# Patient Record
Sex: Male | Born: 1950 | Race: White | Hispanic: No | Smoking: Former smoker
Health system: Southern US, Community
[De-identification: ages and names within clinical notes are randomized; demographics above are authoritative.]

## PROBLEM LIST (undated history)

## (undated) DIAGNOSIS — R011 Cardiac murmur, unspecified: Secondary | ICD-10-CM

## (undated) DIAGNOSIS — E785 Hyperlipidemia, unspecified: Secondary | ICD-10-CM

## (undated) DIAGNOSIS — C61 Malignant neoplasm of prostate: Secondary | ICD-10-CM

## (undated) DIAGNOSIS — K759 Inflammatory liver disease, unspecified: Secondary | ICD-10-CM

## (undated) HISTORY — PX: TONSILLECTOMY: SUR1361

## (undated) HISTORY — PX: MOUTH SURGERY: SHX715

## (undated) HISTORY — DX: Hyperlipidemia, unspecified: E78.5

## (undated) HISTORY — DX: Inflammatory liver disease, unspecified: K75.9

## (undated) HISTORY — DX: Cardiac murmur, unspecified: R01.1

## (undated) HISTORY — PX: SKIN BIOPSY: SHX1

---
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
## 2007-10-24 ENCOUNTER — Ambulatory Visit: Payer: Self-pay | Admitting: Gastroenterology

## 2013-07-23 ENCOUNTER — Ambulatory Visit (INDEPENDENT_AMBULATORY_CARE_PROVIDER_SITE_OTHER): Payer: BC Managed Care – PPO

## 2013-07-23 ENCOUNTER — Ambulatory Visit (INDEPENDENT_AMBULATORY_CARE_PROVIDER_SITE_OTHER): Payer: BC Managed Care – PPO | Admitting: Podiatry

## 2013-07-23 ENCOUNTER — Encounter: Payer: Self-pay | Admitting: Podiatry

## 2013-07-23 VITALS — BP 117/77 | HR 74 | Resp 16 | Ht 76.0 in | Wt 250.0 lb

## 2013-07-23 DIAGNOSIS — M722 Plantar fascial fibromatosis: Secondary | ICD-10-CM

## 2013-07-23 NOTE — Progress Notes (Signed)
  Subjective:    Patient ID: Roy Joyce, male    DOB: 1951-01-11, 62 y.o.   MRN: [id number]  HPI Comments: I've got a heel that hurts, the left"  Plantar heel left  N - sharp L - plantar heel left D - 3 mos  O - gradual C - AM pain, worse, active walker (5-6 miles x 5 days/wk) A - walking T - no treatment      Review of Systems  All other systems reviewed and are negative.       Objective:   Physical Exam : I have evaluated his past medical history medications and allergies as well as his review of systems. Vital signs are stable he is alert and oriented x3. Lower extremity evaluation demonstrates strong palpable pulses bilateral deep tendon reflexes are intact bilateral neurologic sensorium is intact per Semmes-Weinstein monofilament bilateral orthopedic evaluation demonstrates pain on palpation medial calcaneal tubercle of the left heel. Radiographic evaluation demonstrates soft tissue increase in density at the plantar fascial calcaneal insertion site digit of a plantar fasciitis.        Assessment & Plan:  Impression: Plantar fasciitis left  Plan: We discussed the etiology pathology conservative versus surgical therapies. At this point we have initiated therapy with the injection of Kenalog 20 mg and local anesthetic to the point of maximal tenderness of his left heel. Also wrote her prescription for Medrol Dosepak to be followed by Mobic and gave him instructions on this. Also given a plantar fascial strapping and a night splint. Discussed appropriate shoe gear stretching exercises and ice therapy. I will followup with him in one month.

## 2013-07-23 NOTE — Patient Instructions (Signed)
Plantar Fasciitis Plantar fasciitis is a common condition that causes foot pain. It is soreness (inflammation) of the band of tough fibrous tissue on the bottom of the foot that runs from the heel bone (calcaneus) to the ball of the foot. The cause of this soreness may be from excessive standing, poor fitting shoes, running on hard surfaces, being overweight, having an abnormal walk, or overuse (this is common in runners) of the painful foot or feet. It is also common in aerobic exercise dancers and ballet dancers. SYMPTOMS  Most people with plantar fasciitis complain of:  Severe pain in the morning on the bottom of their foot especially when taking the first steps out of bed. This pain recedes after a few minutes of walking.  Severe pain is experienced also during walking following a long period of inactivity.  Pain is worse when walking barefoot or up stairs DIAGNOSIS   Your caregiver will diagnose this condition by examining and feeling your foot.  Special tests such as X-rays of your foot, are usually not needed. PREVENTION   Consult a sports medicine professional before beginning a new exercise program.  Walking programs offer a good workout. With walking there is a lower chance of overuse injuries common to runners. There is less impact and less jarring of the joints.  Begin all new exercise programs slowly. If problems or pain develop, decrease the amount of time or distance until you are at a comfortable level.  Wear good shoes and replace them regularly.  Stretch your foot and the heel cords at the back of the ankle (Achilles tendon) both before and after exercise.  Run or exercise on even surfaces that are not hard. For example, asphalt is better than pavement.  Do not run barefoot on hard surfaces.  If using a treadmill, vary the incline.  Do not continue to workout if you have foot or joint problems. Seek professional help if they do not improve. HOME CARE INSTRUCTIONS     Avoid activities that cause you pain until you recover.  Use ice or cold packs on the problem or painful areas after working out.  Only take over-the-counter or prescription medicines for pain, discomfort, or fever as directed by your caregiver.  Soft shoe inserts or athletic shoes with air or gel sole cushions may be helpful.  If problems continue or become more severe, consult a sports medicine caregiver or your own health care provider. Cortisone is a potent anti-inflammatory medication that may be injected into the painful area. You can discuss this treatment with your caregiver. MAKE SURE YOU:   Understand these instructions.  Will watch your condition.  Will get help right away if you are not doing well or get worse. Document Released: 06/06/2001 Document Revised: 12/04/2011 Document Reviewed: 08/05/2008 [employer] Patient Information 2014 [employer]. Plantar Fasciitis (Heel Spur Syndrome) with Rehab The plantar fascia is a fibrous, ligament-like, soft-tissue structure that spans the bottom of the foot. Plantar fasciitis is a condition that causes pain in the foot due to inflammation of the tissue. SYMPTOMS  Pain and tenderness on the underneath side of the foot. Pain that worsens with standing or walking. CAUSES  Plantar fasciitis is caused by irritation and injury to the plantar fascia on the underneath side of the foot. Common mechanisms of injury include: Direct trauma to bottom of the foot. Damage to a small nerve that runs under the foot where the main fascia attaches to the heel bone. Stress placed on the plantar fascia due   to bone spurs. RISK INCREASES WITH:  Activities that place stress on the plantar fascia (running, jumping, pivoting, or cutting). Poor strength and flexibility. Improperly fitted shoes. Tight calf muscles. Flat feet. Failure to warm-up properly before activity. Obesity. PREVENTION Warm up and stretch properly before activity. Allow for  adequate recovery between workouts. Maintain physical fitness: Strength, flexibility, and endurance. Cardiovascular fitness. Maintain a health body weight. Avoid stress on the plantar fascia. Wear properly fitted shoes, including arch supports for individuals who have flat feet. PROGNOSIS  If treated properly, then the symptoms of plantar fasciitis usually resolve without surgery. However, occasionally surgery is necessary. RELATED COMPLICATIONS  Recurrent symptoms that may result in a chronic condition. Problems of the lower back that are caused by compensating for the injury, such as limping. Pain or weakness of the foot during push-off following surgery. Chronic inflammation, scarring, and partial or complete fascia tear, occurring more often from repeated injections. TREATMENT  Treatment initially involves the use of ice and medication to help reduce pain and inflammation. The use of strengthening and stretching exercises may help reduce pain with activity, especially stretches of the Achilles tendon. These exercises may be performed at home or with a therapist. Your caregiver may recommend that you use heel cups of arch supports to help reduce stress on the plantar fascia. Occasionally, corticosteroid injections are given to reduce inflammation. If symptoms persist for greater than 6 months despite non-surgical (conservative), then surgery may be recommended.  MEDICATION  If pain medication is necessary, then nonsteroidal anti-inflammatory medications, such as aspirin and ibuprofen, or other minor pain relievers, such as acetaminophen, are often recommended. Do not take pain medication within 7 days before surgery. Prescription pain relievers may be given if deemed necessary by your caregiver. Use only as directed and only as much as you need. Corticosteroid injections may be given by your caregiver. These injections should be reserved for the most serious cases, because they may only be  given a certain number of times. HEAT AND COLD Cold treatment (icing) relieves pain and reduces inflammation. Cold treatment should be applied for 10 to 15 minutes every 2 to 3 hours for inflammation and pain and immediately after any activity that aggravates your symptoms. Use ice packs or massage the area with a piece of ice (ice massage). Heat treatment may be used prior to performing the stretching and strengthening activities prescribed by your caregiver, physical therapist, or athletic trainer. Use a heat pack or soak the injury in warm water. SEEK IMMEDIATE MEDICAL CARE IF: Treatment seems to offer no benefit, or the condition worsens. Any medications produce adverse side effects. EXERCISES RANGE OF MOTION (ROM) AND STRETCHING EXERCISES - Plantar Fasciitis (Heel Spur Syndrome) These exercises may help you when beginning to rehabilitate your injury. Your symptoms may resolve with or without further involvement from your physician, physical therapist or athletic trainer. While completing these exercises, remember:  Restoring tissue flexibility helps normal motion to return to the joints. This allows healthier, less painful movement and activity. An effective stretch should be held for at least 30 seconds. A stretch should never be painful. You should only feel a gentle lengthening or release in the stretched tissue. RANGE OF MOTION - Toe Extension, Flexion Sit with your right / left leg crossed over your opposite knee. Grasp your toes and gently pull them back toward the top of your foot. You should feel a stretch on the bottom of your toes and/or foot. Hold this stretch for __________ seconds. Now, gently   pull your toes toward the bottom of your foot. You should feel a stretch on the top of your toes and or foot. Hold this stretch for __________ seconds. Repeat __________ times. Complete this stretch __________ times per day.  RANGE OF MOTION - Ankle Dorsiflexion, Active Assisted Remove  shoes and sit on a chair that is preferably not on a carpeted surface. Place right / left foot under knee. Extend your opposite leg for support. Keeping your heel down, slide your right / left foot back toward the chair until you feel a stretch at your ankle or calf. If you do not feel a stretch, slide your bottom forward to the edge of the chair, while still keeping your heel down. Hold this stretch for __________ seconds. Repeat __________ times. Complete this stretch __________ times per day.  STRETCH  Gastroc, Standing Place hands on wall. Extend right / left leg, keeping the front knee somewhat bent. Slightly point your toes inward on your back foot. Keeping your right / left heel on the floor and your knee straight, shift your weight toward the wall, not allowing your back to arch. You should feel a gentle stretch in the right / left calf. Hold this position for __________ seconds. Repeat __________ times. Complete this stretch __________ times per day. STRETCH  Soleus, Standing Place hands on wall. Extend right / left leg, keeping the other knee somewhat bent. Slightly point your toes inward on your back foot. Keep your right / left heel on the floor, bend your back knee, and slightly shift your weight over the back leg so that you feel a gentle stretch deep in your back calf. Hold this position for __________ seconds. Repeat __________ times. Complete this stretch __________ times per day. STRETCH  Gastrocsoleus, Standing  Note: This exercise can place a lot of stress on your foot and ankle. Please complete this exercise only if specifically instructed by your caregiver.  Place the ball of your right / left foot on a step, keeping your other foot firmly on the same step. Hold on to the wall or a rail for balance. Slowly lift your other foot, allowing your body weight to press your heel down over the edge of the step. You should feel a stretch in your right / left calf. Hold this  position for __________ seconds. Repeat this exercise with a slight bend in your right / left knee. Repeat __________ times. Complete this stretch __________ times per day.  STRENGTHENING EXERCISES - Plantar Fasciitis (Heel Spur Syndrome)  These exercises may help you when beginning to rehabilitate your injury. They may resolve your symptoms with or without further involvement from your physician, physical therapist or athletic trainer. While completing these exercises, remember:  Muscles can gain both the endurance and the strength needed for everyday activities through controlled exercises. Complete these exercises as instructed by your physician, physical therapist or athletic trainer. Progress the resistance and repetitions only as guided. STRENGTH - Towel Curls Sit in a chair positioned on a non-carpeted surface. Place your foot on a towel, keeping your heel on the floor. Pull the towel toward your heel by only curling your toes. Keep your heel on the floor. If instructed by your physician, physical therapist or athletic trainer, add ____________________ at the end of the towel. Repeat __________ times. Complete this exercise __________ times per day. STRENGTH - Ankle Inversion Secure one end of a rubber exercise band/tubing to a fixed object (table, pole). Loop the other end around your foot   just before your toes. Place your fists between your knees. This will focus your strengthening at your ankle. Slowly, pull your big toe up and in, making sure the band/tubing is positioned to resist the entire motion. Hold this position for __________ seconds. Have your muscles resist the band/tubing as it slowly pulls your foot back to the starting position. Repeat __________ times. Complete this exercises __________ times per day.  Document Released: 09/11/2005 Document Revised: 12/04/2011 Document Reviewed: 12/24/2008 [employer] Patient Information 2014 [employer].  

## 2013-07-24 ENCOUNTER — Other Ambulatory Visit: Payer: Self-pay | Admitting: Podiatry

## 2013-07-24 ENCOUNTER — Telehealth: Payer: Self-pay | Admitting: *Deleted

## 2013-07-24 MED ORDER — METHYLPREDNISOLONE (PAK) 4 MG PO TABS: ORAL_TABLET | ORAL | Status: DC

## 2013-07-24 MED ORDER — MELOXICAM 15 MG PO TABS: 15.0000 mg | ORAL_TABLET | Freq: Every day | ORAL | Status: DC

## 2013-07-24 NOTE — Telephone Encounter (Signed)
Sorry. But pt came in yesterday wed 10.29.14. i didn't put this info in earlier note...Marland KitchenMarland Kitchen

## 2013-07-24 NOTE — Telephone Encounter (Signed)
Pt called said you were supposed to send a rx to his pharmacy. Nothing is there. ([facility] is correct in computer)

## 2013-07-24 NOTE — Telephone Encounter (Signed)
Called pt letting him know that rx should be at pharmacy.

## 2013-08-25 ENCOUNTER — Ambulatory Visit (INDEPENDENT_AMBULATORY_CARE_PROVIDER_SITE_OTHER): Payer: BC Managed Care – PPO | Admitting: Podiatry

## 2013-08-25 ENCOUNTER — Encounter: Payer: Self-pay | Admitting: Podiatry

## 2013-08-25 VITALS — BP 123/77 | HR 99 | Ht 76.0 in | Wt 258.0 lb

## 2013-08-25 DIAGNOSIS — M722 Plantar fascial fibromatosis: Secondary | ICD-10-CM

## 2013-08-25 NOTE — Progress Notes (Signed)
Roy Joyce presents today for followup of plantar fasciitis left foot states it is getting better.  Objective: Pulses remain palpable left lower extremity no calf pain. Pain on palpation medial calcaneal tubercle of the left heel.  Assessment: Residual plantar fasciitis her left.  Plan: Reinjected left heel today continue all conservative therapies. Remember at to ask him how history to [place] was and whether or not he went diving

## 2013-09-22 ENCOUNTER — Ambulatory Visit (INDEPENDENT_AMBULATORY_CARE_PROVIDER_SITE_OTHER): Payer: BC Managed Care – PPO | Admitting: Podiatry

## 2013-09-22 ENCOUNTER — Encounter: Payer: Self-pay | Admitting: Podiatry

## 2013-09-22 VITALS — BP 133/69 | HR 76 | Resp 16

## 2013-09-22 DIAGNOSIS — M722 Plantar fascial fibromatosis: Secondary | ICD-10-CM

## 2013-09-22 NOTE — Progress Notes (Signed)
   Subjective:    Patient ID: Roy Joyce, male    DOB: [birth year], 62 y.o.   MRN: [id number]  HPI Comments: " NOT MUCH CHANGE , ITS ABOUT THE SAME     Review of Systems     Objective:   Physical Exam: Vital signs are stable he is alert and oriented x3. Some residual that tenderness to his left plantar heel at the calcaneal insertion site of the plantar fascia.          Assessment & Plan:  Assessment: Plantar fasciitis resolving in nature left heel.  Plan: Injected left heel third time today Kenalog and local anesthetic. Followup with him in 6 weeks if needed.  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

## 2013-11-03 ENCOUNTER — Ambulatory Visit: Payer: BC Managed Care – PPO | Admitting: Podiatry

## 2013-11-17 ENCOUNTER — Other Ambulatory Visit: Payer: Self-pay | Admitting: *Deleted

## 2013-11-17 MED ORDER — MELOXICAM 15 MG PO TABS: 15.0000 mg | ORAL_TABLET | Freq: Every day | ORAL | Status: DC

## 2013-11-17 NOTE — Telephone Encounter (Signed)
Received refill request for mobic 15 mg . Approved it

## 2014-05-04 ENCOUNTER — Encounter: Payer: Self-pay | Admitting: Podiatry

## 2014-05-04 ENCOUNTER — Ambulatory Visit (INDEPENDENT_AMBULATORY_CARE_PROVIDER_SITE_OTHER): Payer: BC Managed Care – PPO | Admitting: Podiatry

## 2014-05-04 VITALS — BP 123/69 | HR 66 | Resp 16

## 2014-05-04 DIAGNOSIS — M722 Plantar fascial fibromatosis: Secondary | ICD-10-CM

## 2014-05-04 MED ORDER — MELOXICAM 15 MG PO TABS: 15.0000 mg | ORAL_TABLET | Freq: Every day | ORAL | Status: DC

## 2014-05-04 MED ORDER — METHYLPREDNISOLONE (PAK) 4 MG PO TABS: ORAL_TABLET | ORAL | Status: DC

## 2014-05-04 NOTE — Progress Notes (Signed)
He presents today with a chief complaint of pain to his left heel. He states it started to get really bad once again last week.  Objective: Vital signs are stable he is alert and oriented x3. Pain on palpation medial continued tubercle and plantar calcaneal tubercle of his left heel.  Assessment: Plantar fasciitis left.  Plan: Injected his left heel central fascial band. Started him on his Medrol Dosepak to be followed by meloxicam. Plantar fascial strapping was applied. He'll followup with him in one month he will continue all conservative therapies.

## 2014-06-03 ENCOUNTER — Ambulatory Visit (INDEPENDENT_AMBULATORY_CARE_PROVIDER_SITE_OTHER): Payer: BC Managed Care – PPO | Admitting: Podiatry

## 2014-06-03 VITALS — BP 106/70 | HR 79 | Resp 16

## 2014-06-03 DIAGNOSIS — M722 Plantar fascial fibromatosis: Secondary | ICD-10-CM

## 2014-06-03 NOTE — Progress Notes (Signed)
He presents today for his second visit since his last flareup. He states that he is approximately 60% improved.  Objective: Vital signs are stable alert and oriented x3. Pain on palpation left heel.  Assessment: Plantar fasciitis resolving.  Plan: Discussed the etiology pathology conservative versus surgical therapies reinjected the left heel today with Kenalog and local anesthetic he'll continue all of his conservative therapies including meloxicam and followup with me in one month if necessary to

## 2014-07-01 ENCOUNTER — Ambulatory Visit (INDEPENDENT_AMBULATORY_CARE_PROVIDER_SITE_OTHER): Payer: BC Managed Care – PPO | Admitting: Podiatry

## 2014-07-01 VITALS — BP 107/75 | HR 80 | Resp 16

## 2014-07-01 DIAGNOSIS — M722 Plantar fascial fibromatosis: Secondary | ICD-10-CM

## 2014-07-01 NOTE — Progress Notes (Signed)
He presents today stating that he is approximately 80% improved.  Objective: Vital signs are stable he is alert and oriented x3. He has pain on palpation medial continued tubercle of the left heel. Particularly a lateral aspect.  Assessment: Plantar fasciitis laterally located left foot.  Plan: Discussed the possible need for orthotics next visit. And injected his left heel today.

## 2014-07-29 ENCOUNTER — Ambulatory Visit: Payer: BC Managed Care – PPO | Admitting: Podiatry

## 2014-08-05 ENCOUNTER — Ambulatory Visit: Payer: BC Managed Care – PPO | Admitting: Podiatry

## 2014-08-24 ENCOUNTER — Other Ambulatory Visit: Payer: Self-pay | Admitting: Podiatry

## 2015-03-09 ENCOUNTER — Ambulatory Visit (INDEPENDENT_AMBULATORY_CARE_PROVIDER_SITE_OTHER): Payer: BLUE CROSS/BLUE SHIELD | Admitting: Urology

## 2015-03-09 VITALS — BP 110/75 | HR 71 | Ht 76.0 in | Wt 242.5 lb

## 2015-03-09 DIAGNOSIS — R972 Elevated prostate specific antigen [PSA]: Secondary | ICD-10-CM

## 2015-03-09 DIAGNOSIS — N529 Male erectile dysfunction, unspecified: Secondary | ICD-10-CM | POA: Diagnosis not present

## 2015-03-09 DIAGNOSIS — E785 Hyperlipidemia, unspecified: Secondary | ICD-10-CM | POA: Diagnosis not present

## 2015-03-09 NOTE — Progress Notes (Signed)
03/09/2015 11:27 AM   Roy Joyce 1951-08-03 [id number]  Referring provider: Ashok Norris, MD [address] [place], [place] [address]  Chief Complaint  Patient presents with  . Elevated PSA    PSA rising last result 02/17/2015 4.8ng/ml    HPI:  1. Elevated PSA - several year history of progressive rise in PSA. No FHX prostate cancer. 2014 2.0 2015 3.0 01/2015 4.8 / DRE 60gm smooth  2. Erectile dysfunction - slowly progressive inability to maintain erection to climax. Uses 5-10mg  Cialis prn with satisfaction. Libido preserved.  Today "Roy Joyce" is seen as new patient for above. He has been atively trying to lose weight recently and is down almost 50lbs! 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    PMH: Past Medical History  Diagnosis Date  . Hyperlipidemia   . Hepatitis   . Heart murmur     Surgical History: Past Surgical History  Procedure Laterality Date  . Tonsillectomy    . Mouth surgery    . Skin biopsy      Home Medications:    Medication List       This list is accurate as of: 03/09/15 11:27 AM.  Always use your most recent med list.               aspirin 81 MG tablet  Take 81 mg by mouth daily.     fish oil-omega-3 fatty acids 1000 MG capsule  Take 2 g by mouth daily.     glucosamine-chondroitin 500-400 MG tablet  Take 1 tablet by mouth 3 (three) times daily.     lovastatin 40 MG tablet  Commonly known as:  MEVACOR  Take 40 mg by mouth at bedtime.     meloxicam 15 MG tablet  Commonly known as:  MOBIC  TAKE 1 TABLET (15 MG TOTAL) BY MOUTH DAILY.     methylPREDNIsolone 4 MG tablet  Commonly known as:  MEDROL DOSPACK  follow package directions     multivitamin capsule  Take 1 capsule by mouth daily.     VITAMIN D PO  Take by mouth.        Allergies: No Known Allergies  Family History: No family history on file.  Social History:  reports that he quit smoking about 10 years ago. He has never used  smokeless tobacco. He reports that he drinks about 1.2 oz of alcohol per week. He reports that he does not use illicit drugs.  ROS: Urological Symptom Review  Patient is experiencing the following symptoms: Erection problems (male only)   Review of Systems  Gastrointestinal (upper)  : Negative for upper GI symptoms  Gastrointestinal (lower) : Negative for lower GI symptoms  Constitutional : Negative for symptoms  Skin: Negative for skin symptoms  Eyes: Negative for eye symptoms  Ear/Nose/Throat : Negative for Ear/Nose/Throat symptoms  Hematologic/Lymphatic: Negative for Hematologic/Lymphatic symptoms  Cardiovascular : Negative for cardiovascular symptoms  Respiratory : Negative for respiratory symptoms  Endocrine: Negative for endocrine symptoms  Musculoskeletal: Negative for musculoskeletal symptoms  Neurological: Negative for neurological symptoms  Psychologic: Negative for psychiatric symptoms   Physical Exam: BP 110/75 mmHg  Pulse 71  Ht 6\' 4"  (1.93 m)  Wt 242 lb 8 oz (109.997 kg)  BMI 29.53 kg/m2  Constitutional:  Alert and oriented, No acute distress. HEENT: [place] AT, moist mucus membranes.  Trachea midline, no masses. Cardiovascular: No clubbing, cyanosis, or edema. Respiratory: Normal respiratory effort, no increased work of breathing. GI: Abdomen is soft,  nontender, nondistended, no abdominal masses GU: No CVA tenderness.  DRE 60gm smooth. Skin: No rashes, bruises or suspicious lesions. Lymph: No cervical or inguinal adenopathy. Neurologic: Grossly intact, no focal deficits, moving all 4 extremities. Psychiatric: Normal mood and affect.  Laboratory Data: No results found for: WBC, HGB, HCT, MCV, PLT  No results found for: CREATININE  No results found for: PSA  No results found for: TESTOSTERONE  No results found for: HGBA1C  Urinalysis No results found for: COLORURINE, APPEARANCEUR, LABSPEC, PHURINE, GLUCOSEU, HGBUR, BILIRUBINUR,  KETONESUR, PROTEINUR, UROBILINOGEN, NITRITE, LEUKOCYTESUR  Pertinent Imaging: none  Assessment & Plan:    1. Elevated PSA - slowly progressive rist in man with minimal comorbidity. Discussed close follow up v. Biopsy and we both agree to proceed with latter. Risks, benefits discussed. Will stop ASA few days prior.    2. Erectile dysfunction - well controlled on prn PDE5i, continue.   Return in about 2 weeks (around 03/23/2015) for next available prostate biopsy.  Roy Joyce, [facility] [address], [place], [place] [address] [phone]

## 2015-03-25 ENCOUNTER — Ambulatory Visit (INDEPENDENT_AMBULATORY_CARE_PROVIDER_SITE_OTHER): Payer: BLUE CROSS/BLUE SHIELD | Admitting: Urology

## 2015-03-25 ENCOUNTER — Other Ambulatory Visit: Payer: Self-pay | Admitting: Urology

## 2015-03-25 VITALS — BP 126/81 | HR 70 | Ht 76.0 in | Wt 237.4 lb

## 2015-03-25 DIAGNOSIS — R972 Elevated prostate specific antigen [PSA]: Secondary | ICD-10-CM

## 2015-03-25 MED ORDER — LEVOFLOXACIN 500 MG PO TABS
500.0000 mg | ORAL_TABLET | Freq: Once | ORAL | Status: AC
  Administered 2015-03-25: 500 mg via ORAL

## 2015-03-25 MED ORDER — GENTAMICIN SULFATE 40 MG/ML IJ SOLN
80.0000 mg | Freq: Once | INTRAMUSCULAR | Status: AC
  Administered 2015-03-25: 80 mg via INTRAMUSCULAR

## 2015-03-25 NOTE — Procedures (Signed)
Prostate Biopsy Procedure   Informed consent was obtained after discussing risks/benefits of the procedure.  A time out was performed to ensure correct patient identity.  Pre-Procedure: - Last PSA Level: No results found for: PSA - Gentamicin given prophylactically - Levaquin 500 mg administered PO -Transrectal Ultrasound performed revealing a 39 gm prostate, with a median lobe -No significant hypoechoic or median lobe noted  Procedure: - Prostate block performed using 10 cc 1% lidocaine and biopsies taken from sextant areas, a total of 12 under ultrasound guidance.  Post-Procedure: - Patient tolerated the procedure well - He was counseled to seek immediate medical attention if experiences any severe pain, significant bleeding, or fevers - Return in one week to discuss biopsy results

## 2015-03-30 LAB — PATHOLOGY REPORT

## 2015-03-31 ENCOUNTER — Other Ambulatory Visit: Payer: Self-pay | Admitting: Urology

## 2015-04-06 ENCOUNTER — Ambulatory Visit (INDEPENDENT_AMBULATORY_CARE_PROVIDER_SITE_OTHER): Payer: BLUE CROSS/BLUE SHIELD | Admitting: Urology

## 2015-04-06 ENCOUNTER — Encounter: Payer: Self-pay | Admitting: Urology

## 2015-04-06 VITALS — BP 134/79 | HR 80 | Ht 76.0 in | Wt 240.6 lb

## 2015-04-06 DIAGNOSIS — C61 Malignant neoplasm of prostate: Secondary | ICD-10-CM | POA: Diagnosis not present

## 2015-04-06 NOTE — Progress Notes (Signed)
5:34 PM   Roy Joyce 28-Jan-1951 [id number]  Referring provider: Ashok Norris, MD [address] [place], [place] [address]  Chief Complaint  Patient presents with  . Follow-up    Biopsy results    HPI: Patient presents today for discussion of his new diagnosis of prostate cancer.  He recovered well from his biopsy and is no longer having any bleeding.  He denies any fevers/chills or blood per rectum.  He is here today with is wife.  I have called the patient with the results of his biopsy and referred him to the NCCN guidelines for more information.     PMH: Past Medical History  Diagnosis Date  . Hyperlipidemia   . Hepatitis   . Heart murmur     Surgical History: Past Surgical History  Procedure Laterality Date  . Tonsillectomy    . Mouth surgery    . Skin biopsy      Home Medications:    Medication List       This list is accurate as of: 04/06/15  5:34 PM.  Always use your most recent med list.               aspirin 81 MG tablet  Take 81 mg by mouth daily.     CIALIS 20 MG tablet  Generic drug:  tadalafil  TAKE ONE TABLET BY MOUTH ONCE DAILY AS NEEDED     fish oil-omega-3 fatty acids 1000 MG capsule  Take 2 g by mouth daily.     glucosamine-chondroitin 500-400 MG tablet  Take 1 tablet by mouth 3 (three) times daily.     lovastatin 40 MG tablet  Commonly known as:  MEVACOR  Take 40 mg by mouth at bedtime.     meloxicam 15 MG tablet  Commonly known as:  MOBIC  TAKE 1 TABLET (15 MG TOTAL) BY MOUTH DAILY.     multivitamin capsule  Take 1 capsule by mouth daily.     VITAMIN D PO  Take by mouth.        Allergies: No Known Allergies  Family History: Family History  Problem Relation Age of Onset  . Kidney cancer Neg Hx   . Lymphoma Father   . Prostate cancer Neg Hx   . Bladder Cancer Neg Hx     Social History:  reports that he quit smoking about 10 years ago. He has never used smokeless tobacco. He reports that he  drinks about 1.2 oz of alcohol per week. He reports that he does not use illicit drugs.  ROS: Urological Symptom Review  Patient is experiencing the following symptoms: Erection problems (male only)   Review of Systems  Gastrointestinal (upper)  : Negative for upper GI symptoms  Gastrointestinal (lower) : Negative for lower GI symptoms  Constitutional : Negative for symptoms  Skin: Negative for skin symptoms  Eyes: Negative for eye symptoms  Ear/Nose/Throat : Negative for Ear/Nose/Throat symptoms  Hematologic/Lymphatic: Negative for Hematologic/Lymphatic symptoms  Cardiovascular : Negative for cardiovascular symptoms  Respiratory : Negative for respiratory symptoms  Endocrine: Negative for endocrine symptoms  Musculoskeletal: Negative for musculoskeletal symptoms  Neurological: Negative for neurological symptoms  Psychologic: Negative for psychiatric symptoms   Physical Exam: BP 134/79 mmHg  Pulse 80  Ht 6\' 4"  (1.93 m)  Wt 240 lb 9.6 oz (109.135 kg)  BMI 29.30 kg/m2  Constitutional:  Alert and oriented, No acute distress. HEENT: [place] AT, moist mucus membranes.  Trachea midline, no masses. Cardiovascular: No clubbing,  cyanosis, or edema. Respiratory: Normal respiratory effort, no increased work of breathing. GI: Abdomen is soft, nontender, nondistended, no abdominal masses GU: No CVA tenderness.  DRE 60gm smooth. Skin: No rashes, bruises or suspicious lesions. Lymph: No cervical or inguinal adenopathy. Neurologic: Grossly intact, no focal deficits, moving all 4 extremities. Psychiatric: Normal mood and affect.  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  His PSA at the time of biopsy was 4.7.  However the natural history of prostate cancer, and I discussed the patient's pathology with him in great detail. I explained to him the risks classification system if informed him that he is intermediate risk for spread/metastasis within 10 years if left untreated. We discussed briefly the role of active surveillance and intermediate risk patient which I do not recommend given his risk of spread and progression. We then discussed surgery, external beam radiation therapy, and brachytherapy as viable options. Given that the patient has minimal voiding symptoms and a reasonably small prostate without a median lobe, I think that brachytherapy would be a reasonable option for him. I discussed the risks and the benefits of all the different therapies. Ultimately, I recommended that the patient consider either surgery or brachii therapy. The patient will consider his options and contact me with his final decision.   No Follow-up on file.  Ardis Hughs, [facility] [address], [place], [place] [address] [phone]  I spent 45 minutes with the patient in face-to-face consultation.

## 2015-04-16 ENCOUNTER — Telehealth: Payer: Self-pay

## 2015-04-16 NOTE — Telephone Encounter (Signed)
  Oncology Nurse Navigator Documentation  Referral date to RadOnc/MedOnc: 04/16/15 (04/16/15 0900) Navigator Encounter Type: Introductory phone call (04/16/15 0900)               Introduced navigational services to patient. He states he is still trying to decide what treatment option to go with. Surgery vs XRT. At this time leaning more towards surgery. Some basic questions answered for him. He wants to talk with Dr Louis Meckel again. Offered XRT consult as well if felt needed.

## 2015-04-17 ENCOUNTER — Encounter: Payer: Self-pay | Admitting: Family Medicine

## 2015-04-19 ENCOUNTER — Other Ambulatory Visit: Payer: Self-pay

## 2015-04-19 MED ORDER — LOVASTATIN 40 MG PO TABS: 40.0000 mg | ORAL_TABLET | Freq: Every day | ORAL | Status: DC

## 2015-05-11 ENCOUNTER — Other Ambulatory Visit: Payer: Self-pay | Admitting: Urology

## 2015-06-03 NOTE — Patient Instructions (Addendum)
Roy Joyce  06/03/2015   Your procedure is scheduled on: Friday 06/11/2015  Report to [facility] Main  Entrance take [place]  elevators to 3rd floor to  [place] at  [place] AM.  Call this number if you have problems the morning of surgery [phone]   Remember: ONLY 1 PERSON MAY GO WITH YOU TO SHORT STAY TO GET  READY MORNING OF [place].  Do not eat food or drink liquids :After Midnight.   FOLLOW BOWEL PREP INSTRUCTIONS FROM DR. HERRICK'S OFFICE AND FOLLOW CLEAR LIQUID DIET THE DAY BEFORE SURGERY!   CLEAR LIQUID DIET   Foods Allowed                                                                     Foods Excluded  Coffee and tea, regular and decaf                             liquids that you cannot  Plain Jell-O in any flavor                                             see through such as: Fruit ices (not with fruit pulp)                                     milk, soups, orange juice  Iced Popsicles                                    All solid food Carbonated beverages, regular and diet                                    Cranberry, grape and apple juices Sports drinks like Gatorade Lightly seasoned clear broth or consume(fat free) Sugar, honey syrup  Sample Menu Breakfast                                Lunch                                     Supper Cranberry juice                    Beef broth                            Chicken broth Jell-O                                     Grape juice  Apple juice Coffee or tea                        Jell-O                                      Popsicle                                                Coffee or tea                        Coffee or tea  _____________________________________________________________________     Take these medicines the morning of surgery with A SIP OF WATER: none                               You may not have any metal on your body including hair  pins and              piercings  Do not wear jewelry, make-up, lotions, powders or perfumes, deodorant             Do not wear nail polish.  Do not shave  48 hours prior to surgery.              Men may shave face and neck.   Do not bring valuables to the hospital. [place].  Contacts, dentures or bridgework may not be worn into surgery.  Leave suitcase in the car. After surgery it may be brought to your room.     Patients discharged the day of surgery will not be allowed to drive home.  Name and phone number of your driver:  Special Instructions: N/A              Please read over the following fact sheets you were given: _____________________________________________________________________             [facility] - Preparing for Surgery Before surgery, you can play an important role.  Because skin is not sterile, your skin needs to be as free of germs as possible.  You can reduce the number of germs on your skin by washing with CHG (chlorahexidine gluconate) soap before surgery.  CHG is an antiseptic cleaner which kills germs and bonds with the skin to continue killing germs even after washing. Please DO NOT use if you have an allergy to CHG or antibacterial soaps.  If your skin becomes reddened/irritated stop using the CHG and inform your nurse when you arrive at Short Stay. Do not shave (including legs and underarms) for at least 48 hours prior to the first CHG shower.  You may shave your face/neck. Please follow these instructions carefully:  1.  Shower with CHG Soap the night before surgery and the  morning of Surgery.  2.  If you choose to wash your hair, wash your hair first as usual with your  normal  shampoo.  3.  After you shampoo, rinse your hair and body thoroughly to remove the  shampoo.  4.  Use CHG as you would any other liquid soap.  You can apply chg directly  to the skin and wash                        Gently with a scrungie or clean washcloth.  5.  Apply the CHG Soap to your body ONLY FROM THE NECK DOWN.   Do not use on face/ open                           Wound or open sores. Avoid contact with eyes, ears mouth and genitals (private parts).                       Wash face,  Genitals (private parts) with your normal soap.             6.  Wash thoroughly, paying special attention to the area where your surgery  will be performed.  7.  Thoroughly rinse your body with warm water from the neck down.  8.  DO NOT shower/wash with your normal soap after using and rinsing off  the CHG Soap.                9.  Pat yourself dry with a clean towel.            10.  Wear clean pajamas.            11.  Place clean sheets on your bed the night of your first shower and do not  sleep with pets. Day of Surgery : Do not apply any lotions/deodorants the morning of surgery.  Please wear clean clothes to the hospital/surgery center.  FAILURE TO FOLLOW THESE INSTRUCTIONS MAY RESULT IN THE CANCELLATION OF YOUR SURGERY PATIENT SIGNATURE_________________________________  NURSE SIGNATURE__________________________________  ________________________________________________________________________   Roy Joyce  An incentive spirometer is a tool that can help keep your lungs clear and active. This tool measures how well you are filling your lungs with each breath. Taking long deep breaths may help reverse or decrease the chance of developing breathing (pulmonary) problems (especially infection) following:  A long period of time when you are unable to move or be active. BEFORE THE PROCEDURE   If the spirometer includes an indicator to show your best effort, your nurse or respiratory therapist will set it to a desired goal.  If possible, sit up straight or lean slightly forward. Try not to slouch.  Hold the incentive spirometer in an upright position. INSTRUCTIONS FOR USE   Sit on the edge of your  bed if possible, or sit up as far as you can in bed or on a chair.  Hold the incentive spirometer in an upright position.  Breathe out normally.  Place the mouthpiece in your mouth and seal your lips tightly around it.  Breathe in slowly and as deeply as possible, raising the piston or the ball toward the top of the column.  Hold your breath for 3-5 seconds or for as long as possible. Allow the piston or ball to fall to the bottom of the column.  Remove the mouthpiece from your mouth and breathe out normally.  Rest for a few seconds and repeat Steps 1 through 7 at least 10 times every 1-2 hours when you are awake. Take your time and take a few normal breaths between deep breaths.  The spirometer may include an indicator to  show your best effort. Use the indicator as a goal to work toward during each repetition.  After each set of 10 deep breaths, practice coughing to be sure your lungs are clear. If you have an incision (the cut made at the time of surgery), support your incision when coughing by placing a pillow or rolled up towels firmly against it. Once you are able to get out of bed, walk around indoors and cough well. You may stop using the incentive spirometer when instructed by your caregiver.  RISKS AND COMPLICATIONS  Take your time so you do not get dizzy or light-headed.  If you are in pain, you may need to take or ask for pain medication before doing incentive spirometry. It is harder to take a deep breath if you are having pain. AFTER USE  Rest and breathe slowly and easily.  It can be helpful to keep track of a log of your progress. Your caregiver can provide you with a simple table to help with this. If you are using the spirometer at home, follow these instructions: [place] IF:   You are having difficultly using the spirometer.  You have trouble using the spirometer as often as instructed.  Your pain medication is not giving enough relief while using the  spirometer.  You develop fever of 100.5 F (38.1 C) or higher. SEEK IMMEDIATE MEDICAL CARE IF:   You cough up bloody sputum that had not been present before.  You develop fever of 102 F (38.9 C) or greater.  You develop worsening pain at or near the incision site. MAKE SURE YOU:   Understand these instructions.  Will watch your condition.  Will get help right away if you are not doing well or get worse. Document Released: 01/22/2007 Document Revised: 12/04/2011 Document Reviewed: 03/25/2007 Exit[employer] Patient Information 2014 Exit[employer], [place].   ________________________________________________________________________  WHAT IS A BLOOD TRANSFUSION? Blood Transfusion Information  A transfusion is the replacement of blood or some of its parts. Blood is made up of multiple cells which provide different functions.  Red blood cells carry oxygen and are used for blood loss replacement.  White blood cells fight against infection.  Platelets control bleeding.  Plasma helps clot blood.  Other blood products are available for specialized needs, such as hemophilia or other clotting disorders. BEFORE THE TRANSFUSION  Who gives blood for transfusions?   Healthy volunteers who are fully evaluated to make sure their blood is safe. This is blood bank blood. Transfusion therapy is the safest it has ever been in the practice of medicine. Before blood is taken from a donor, a complete history is taken to make sure that person has no history of diseases nor engages in risky social behavior (examples are intravenous drug use or sexual activity with multiple partners). The donor's travel history is screened to minimize risk of transmitting infections, such as malaria. The donated blood is tested for signs of infectious diseases, such as HIV and hepatitis. The blood is then tested to be sure it is compatible with you in order to minimize the chance of a transfusion reaction. If you or a relative  donates blood, this is often done in anticipation of surgery and is not appropriate for emergency situations. It takes many days to process the donated blood. RISKS AND COMPLICATIONS Although transfusion therapy is very safe and saves many lives, the main dangers of transfusion include:   Getting an infectious disease.  Developing a transfusion reaction. This is an allergic reaction  to something in the blood you were given. Every precaution is taken to prevent this. The decision to have a blood transfusion has been considered carefully by your caregiver before blood is given. Blood is not given unless the benefits outweigh the risks. AFTER THE TRANSFUSION  Right after receiving a blood transfusion, you will usually feel much better and more energetic. This is especially true if your red blood cells have gotten low (anemic). The transfusion raises the level of the red blood cells which carry oxygen, and this usually causes an energy increase.  The nurse administering the transfusion will monitor you carefully for complications. HOME CARE INSTRUCTIONS  No special instructions are needed after a transfusion. You may find your energy is better. Speak with your caregiver about any limitations on activity for underlying diseases you may have. SEEK MEDICAL CARE IF:   Your condition is not improving after your transfusion.  You develop redness or irritation at the intravenous (IV) site. SEEK IMMEDIATE MEDICAL CARE IF:  Any of the following symptoms occur over the next 12 hours:  Shaking chills.  You have a temperature by mouth above 102 F (38.9 C), not controlled by medicine.  Chest, back, or muscle pain.  People around you feel you are not acting correctly or are confused.  Shortness of breath or difficulty breathing.  Dizziness and fainting.  You get a rash or develop hives.  You have a decrease in urine output.  Your urine turns a dark color or changes to pink, red, or brown. Any  of the following symptoms occur over the next 10 days:  You have a temperature by mouth above 102 F (38.9 C), not controlled by medicine.  Shortness of breath.  Weakness after normal activity.  The white part of the eye turns yellow (jaundice).  You have a decrease in the amount of urine or are urinating less often.  Your urine turns a dark color or changes to pink, red, or brown. Document Released: 09/08/2000 Document Revised: 12/04/2011 Document Reviewed: 04/27/2008 [facility] Patient Information 2014 [place], [place].  _______________________________________________________________________

## 2015-06-04 ENCOUNTER — Encounter (HOSPITAL_COMMUNITY): Payer: Self-pay

## 2015-06-04 ENCOUNTER — Encounter (HOSPITAL_COMMUNITY)
Admission: RE | Admit: 2015-06-04 | Discharge: 2015-06-04 | Disposition: A | Discharge status: Home / Self Care | Payer: BLUE CROSS/BLUE SHIELD | Source: Ambulatory Visit | Attending: Urology | Admitting: Urology

## 2015-06-04 DIAGNOSIS — C61 Malignant neoplasm of prostate: Secondary | ICD-10-CM | POA: Diagnosis not present

## 2015-06-04 DIAGNOSIS — Z01818 Encounter for other preprocedural examination: Secondary | ICD-10-CM | POA: Diagnosis not present

## 2015-06-04 LAB — CBC
HCT: 42.8 % (ref 39.0–52.0)
Hemoglobin: 14.6 g/dL (ref 13.0–17.0)
MCH: 29.6 pg (ref 26.0–34.0)
MCHC: 34.1 g/dL (ref 30.0–36.0)
MCV: 86.6 fL (ref 78.0–100.0)
PLATELETS: 194 10*3/uL (ref 150–400)
RBC: 4.94 MIL/uL (ref 4.22–5.81)
RDW: 13 % (ref 11.5–15.5)
WBC: 5 10*3/uL (ref 4.0–10.5)

## 2015-06-04 LAB — BASIC METABOLIC PANEL
Anion gap: 7 (ref 5–15)
BUN: 14 mg/dL (ref 6–20)
CALCIUM: 9.1 mg/dL (ref 8.9–10.3)
CHLORIDE: 105 mmol/L (ref 101–111)
CO2: 26 mmol/L (ref 22–32)
CREATININE: 1.07 mg/dL (ref 0.61–1.24)
GFR calc non Af Amer: >60 mL/min (ref >60)
GLUCOSE: 96 mg/dL (ref 65–99)
Potassium: 5.1 mmol/L (ref 3.5–5.1)
Sodium: 138 mmol/L (ref 135–145)

## 2015-06-04 LAB — ABO/RH: ABO/RH(D): A POS

## 2015-06-06 LAB — URINE CULTURE

## 2015-06-11 ENCOUNTER — Inpatient Hospital Stay (HOSPITAL_COMMUNITY)
Admission: RE | Admit: 2015-06-11 | Discharge: 2015-06-12 | DRG: 708 | Disposition: A | Discharge status: Home / Self Care | Payer: BLUE CROSS/BLUE SHIELD | Source: Ambulatory Visit | Attending: Urology | Admitting: Urology

## 2015-06-11 ENCOUNTER — Encounter (HOSPITAL_COMMUNITY)
Admission: RE | Disposition: A | Discharge status: Home / Self Care | Payer: Self-pay | Source: Ambulatory Visit | Attending: Urology

## 2015-06-11 ENCOUNTER — Encounter (HOSPITAL_COMMUNITY): Payer: Self-pay | Admitting: *Deleted

## 2015-06-11 ENCOUNTER — Inpatient Hospital Stay (HOSPITAL_COMMUNITY): Payer: BLUE CROSS/BLUE SHIELD | Admitting: Anesthesiology

## 2015-06-11 DIAGNOSIS — E785 Hyperlipidemia, unspecified: Secondary | ICD-10-CM | POA: Diagnosis present

## 2015-06-11 DIAGNOSIS — Z01812 Encounter for preprocedural laboratory examination: Secondary | ICD-10-CM | POA: Diagnosis not present

## 2015-06-11 DIAGNOSIS — N529 Male erectile dysfunction, unspecified: Secondary | ICD-10-CM | POA: Diagnosis present

## 2015-06-11 DIAGNOSIS — Z79899 Other long term (current) drug therapy: Secondary | ICD-10-CM | POA: Diagnosis not present

## 2015-06-11 DIAGNOSIS — Z85828 Personal history of other malignant neoplasm of skin: Secondary | ICD-10-CM | POA: Diagnosis not present

## 2015-06-11 DIAGNOSIS — Q644 Malformation of urachus: Secondary | ICD-10-CM | POA: Diagnosis not present

## 2015-06-11 DIAGNOSIS — Z87891 Personal history of nicotine dependence: Secondary | ICD-10-CM | POA: Diagnosis not present

## 2015-06-11 DIAGNOSIS — Z7982 Long term (current) use of aspirin: Secondary | ICD-10-CM | POA: Diagnosis not present

## 2015-06-11 DIAGNOSIS — Z807 Family history of other malignant neoplasms of lymphoid, hematopoietic and related tissues: Secondary | ICD-10-CM | POA: Diagnosis not present

## 2015-06-11 DIAGNOSIS — C61 Malignant neoplasm of prostate: Principal | ICD-10-CM | POA: Diagnosis present

## 2015-06-11 HISTORY — PX: LYMPHADENECTOMY: SHX5960

## 2015-06-11 HISTORY — PX: ROBOT ASSISTED LAPAROSCOPIC RADICAL PROSTATECTOMY: SHX5141

## 2015-06-11 LAB — TYPE AND SCREEN
ABO/RH(D): A POS
Antibody Screen: NEGATIVE

## 2015-06-11 LAB — HEMOGLOBIN AND HEMATOCRIT, BLOOD
HCT: 42.6 % (ref 39.0–52.0)
HEMOGLOBIN: 14.3 g/dL (ref 13.0–17.0)

## 2015-06-11 LAB — GLUCOSE, CAPILLARY: GLUCOSE-CAPILLARY: 148 mg/dL — AB (ref 65–99)

## 2015-06-11 SURGERY — ROBOTIC ASSISTED LAPAROSCOPIC RADICAL PROSTATECTOMY
Anesthesia: General | Laterality: Bilateral

## 2015-06-11 MED ORDER — SODIUM CHLORIDE 0.9 % IJ SOLN
INTRAMUSCULAR | Status: AC
  Filled 2015-06-11: qty 10

## 2015-06-11 MED ORDER — ONDANSETRON HCL 4 MG/2ML IJ SOLN
4.0000 mg | INTRAMUSCULAR | Status: DC | PRN
  Administered 2015-06-11 (×2): 4 mg via INTRAVENOUS
  Filled 2015-06-11 (×2): qty 2

## 2015-06-11 MED ORDER — CEFAZOLIN SODIUM 1-5 GM-% IV SOLN
1.0000 g | Freq: Three times a day (TID) | INTRAVENOUS | Status: AC
  Administered 2015-06-11 – 2015-06-12 (×2): 1 g via INTRAVENOUS
  Filled 2015-06-11 (×2): qty 50

## 2015-06-11 MED ORDER — OXYCODONE HCL 5 MG PO TABS
5.0000 mg | ORAL_TABLET | ORAL | Status: DC | PRN
  Administered 2015-06-11: 5 mg via ORAL
  Filled 2015-06-11: qty 1

## 2015-06-11 MED ORDER — FENTANYL CITRATE (PF) 100 MCG/2ML IJ SOLN
INTRAMUSCULAR | Status: DC | PRN
  Administered 2015-06-11 (×4): 50 ug via INTRAVENOUS
  Administered 2015-06-11 (×2): 25 ug via INTRAVENOUS
  Administered 2015-06-11: 100 ug via INTRAVENOUS

## 2015-06-11 MED ORDER — BUPIVACAINE LIPOSOME 1.3 % IJ SUSP
20.0000 mL | Freq: Once | INTRAMUSCULAR | Status: DC
  Filled 2015-06-11: qty 20

## 2015-06-11 MED ORDER — ROCURONIUM BROMIDE 100 MG/10ML IV SOLN
INTRAVENOUS | Status: DC | PRN
  Administered 2015-06-11: 20 mg via INTRAVENOUS
  Administered 2015-06-11 (×2): 10 mg via INTRAVENOUS
  Administered 2015-06-11: 30 mg via INTRAVENOUS
  Administered 2015-06-11: 50 mg via INTRAVENOUS
  Administered 2015-06-11: 20 mg via INTRAVENOUS
  Administered 2015-06-11: 10 mg via INTRAVENOUS

## 2015-06-11 MED ORDER — CEFAZOLIN SODIUM-DEXTROSE 2-3 GM-% IV SOLR
INTRAVENOUS | Status: AC
  Filled 2015-06-11: qty 50

## 2015-06-11 MED ORDER — DOCUSATE SODIUM 100 MG PO CAPS
100.0000 mg | ORAL_CAPSULE | Freq: Two times a day (BID) | ORAL | Status: DC
  Administered 2015-06-11 – 2015-06-12 (×2): 100 mg via ORAL
  Filled 2015-06-11 (×2): qty 1

## 2015-06-11 MED ORDER — PROPOFOL 10 MG/ML IV BOLUS
INTRAVENOUS | Status: DC | PRN
  Administered 2015-06-11: 200 mg via INTRAVENOUS

## 2015-06-11 MED ORDER — HYDROMORPHONE HCL 1 MG/ML IJ SOLN
INTRAMUSCULAR | Status: AC
  Filled 2015-06-11: qty 1

## 2015-06-11 MED ORDER — FENTANYL CITRATE (PF) 100 MCG/2ML IJ SOLN
INTRAMUSCULAR | Status: AC
  Filled 2015-06-11: qty 4

## 2015-06-11 MED ORDER — SULFAMETHOXAZOLE-TRIMETHOPRIM 800-160 MG PO TABS: 1.0000 | ORAL_TABLET | Freq: Two times a day (BID) | ORAL | Status: DC

## 2015-06-11 MED ORDER — DEXAMETHASONE SODIUM PHOSPHATE 4 MG/ML IJ SOLN
INTRAMUSCULAR | Status: DC | PRN
  Administered 2015-06-11: 10 mg via INTRAVENOUS

## 2015-06-11 MED ORDER — GLYCOPYRROLATE 0.2 MG/ML IJ SOLN
INTRAMUSCULAR | Status: DC | PRN
  Administered 2015-06-11: 0.2 mg via INTRAVENOUS
  Administered 2015-06-11: 0.3 mg via INTRAVENOUS

## 2015-06-11 MED ORDER — CIPROFLOXACIN IN D5W 400 MG/200ML IV SOLN
400.0000 mg | INTRAVENOUS | Status: AC
  Administered 2015-06-11: 400 mg via INTRAVENOUS

## 2015-06-11 MED ORDER — BUPIVACAINE-EPINEPHRINE (PF) 0.25% -1:200000 IJ SOLN
INTRAMUSCULAR | Status: AC
  Filled 2015-06-11: qty 30

## 2015-06-11 MED ORDER — FENTANYL CITRATE (PF) 100 MCG/2ML IJ SOLN
INTRAMUSCULAR | Status: AC
  Filled 2015-06-11: qty 2

## 2015-06-11 MED ORDER — BUPIVACAINE LIPOSOME 1.3 % IJ SUSP
INTRAMUSCULAR | Status: DC | PRN
  Administered 2015-06-11: 20 mL

## 2015-06-11 MED ORDER — LIDOCAINE HCL (CARDIAC) 20 MG/ML IV SOLN
INTRAVENOUS | Status: DC | PRN
  Administered 2015-06-11: 50 mg via INTRAVENOUS

## 2015-06-11 MED ORDER — MORPHINE SULFATE (PF) 2 MG/ML IV SOLN: 2.0000 mg | INTRAVENOUS | Status: DC | PRN

## 2015-06-11 MED ORDER — NEOSTIGMINE METHYLSULFATE 10 MG/10ML IV SOLN
INTRAVENOUS | Status: AC
  Filled 2015-06-11: qty 1

## 2015-06-11 MED ORDER — NEOSTIGMINE METHYLSULFATE 10 MG/10ML IV SOLN
INTRAVENOUS | Status: DC | PRN
  Administered 2015-06-11: 4 mg via INTRAVENOUS

## 2015-06-11 MED ORDER — GLYCOPYRROLATE 0.2 MG/ML IJ SOLN
INTRAMUSCULAR | Status: AC
  Filled 2015-06-11: qty 3

## 2015-06-11 MED ORDER — CIPROFLOXACIN IN D5W 400 MG/200ML IV SOLN
INTRAVENOUS | Status: AC
  Filled 2015-06-11: qty 200

## 2015-06-11 MED ORDER — DEXAMETHASONE SODIUM PHOSPHATE 10 MG/ML IJ SOLN
INTRAMUSCULAR | Status: AC
  Filled 2015-06-11: qty 1

## 2015-06-11 MED ORDER — HYDROCODONE-ACETAMINOPHEN 5-325 MG PO TABS: 1.0000 | ORAL_TABLET | ORAL | Status: DC | PRN

## 2015-06-11 MED ORDER — MIDAZOLAM HCL 5 MG/5ML IJ SOLN
INTRAMUSCULAR | Status: DC | PRN
  Administered 2015-06-11: 2 mg via INTRAVENOUS

## 2015-06-11 MED ORDER — METOCLOPRAMIDE HCL 5 MG/ML IJ SOLN
INTRAMUSCULAR | Status: AC
  Filled 2015-06-11: qty 2

## 2015-06-11 MED ORDER — ONDANSETRON HCL 4 MG/2ML IJ SOLN
INTRAMUSCULAR | Status: AC
  Filled 2015-06-11: qty 2

## 2015-06-11 MED ORDER — ACETAMINOPHEN 10 MG/ML IV SOLN
1000.0000 mg | Freq: Four times a day (QID) | INTRAVENOUS | Status: AC
  Administered 2015-06-11 – 2015-06-12 (×3): 1000 mg via INTRAVENOUS
  Filled 2015-06-11 (×3): qty 100

## 2015-06-11 MED ORDER — LACTATED RINGERS IR SOLN: Status: DC | PRN

## 2015-06-11 MED ORDER — ROCURONIUM BROMIDE 100 MG/10ML IV SOLN
INTRAVENOUS | Status: AC
  Filled 2015-06-11: qty 1

## 2015-06-11 MED ORDER — CEFAZOLIN SODIUM-DEXTROSE 2-3 GM-% IV SOLR
2.0000 g | INTRAVENOUS | Status: AC
  Administered 2015-06-11: 2 g via INTRAVENOUS

## 2015-06-11 MED ORDER — SENNA 8.6 MG PO TABS
1.0000 | ORAL_TABLET | Freq: Two times a day (BID) | ORAL | Status: DC
  Administered 2015-06-11 – 2015-06-12 (×2): 8.6 mg via ORAL
  Filled 2015-06-11 (×2): qty 1

## 2015-06-11 MED ORDER — DEXTROSE-NACL 5-0.45 % IV SOLN
INTRAVENOUS | Status: DC
  Administered 2015-06-11 (×2): via INTRAVENOUS

## 2015-06-11 MED ORDER — LACTATED RINGERS IV SOLN
INTRAVENOUS | Status: DC | PRN
  Administered 2015-06-11 (×2): via INTRAVENOUS

## 2015-06-11 MED ORDER — FENTANYL CITRATE (PF) 100 MCG/2ML IJ SOLN
25.0000 ug | INTRAMUSCULAR | Status: DC | PRN
  Administered 2015-06-11: 25 ug via INTRAVENOUS
  Administered 2015-06-11 (×3): 50 ug via INTRAVENOUS

## 2015-06-11 MED ORDER — MIDAZOLAM HCL 2 MG/2ML IJ SOLN
INTRAMUSCULAR | Status: AC
  Filled 2015-06-11: qty 4

## 2015-06-11 MED ORDER — SODIUM CHLORIDE 0.9 % IV BOLUS (SEPSIS)
1000.0000 mL | Freq: Once | INTRAVENOUS | Status: AC
  Administered 2015-06-11: 1000 mL via INTRAVENOUS

## 2015-06-11 MED ORDER — HEPARIN SODIUM (PORCINE) 1000 UNIT/ML IJ SOLN
INTRAMUSCULAR | Status: AC
  Filled 2015-06-11: qty 1

## 2015-06-11 MED ORDER — ONDANSETRON HCL 4 MG/2ML IJ SOLN: 4.0000 mg | Freq: Once | INTRAMUSCULAR | Status: DC | PRN

## 2015-06-11 MED ORDER — KETOROLAC TROMETHAMINE 30 MG/ML IJ SOLN
30.0000 mg | Freq: Four times a day (QID) | INTRAMUSCULAR | Status: DC
  Administered 2015-06-11 – 2015-06-12 (×3): 30 mg via INTRAVENOUS
  Filled 2015-06-11 (×7): qty 1

## 2015-06-11 MED ORDER — LIDOCAINE HCL (CARDIAC) 20 MG/ML IV SOLN
INTRAVENOUS | Status: AC
  Filled 2015-06-11: qty 5

## 2015-06-11 MED ORDER — OXYBUTYNIN CHLORIDE 5 MG PO TABS
5.0000 mg | ORAL_TABLET | Freq: Three times a day (TID) | ORAL | Status: DC | PRN
  Administered 2015-06-11: 5 mg via ORAL
  Filled 2015-06-11: qty 1

## 2015-06-11 MED ORDER — FENTANYL CITRATE (PF) 250 MCG/5ML IJ SOLN
INTRAMUSCULAR | Status: AC
  Filled 2015-06-11: qty 25

## 2015-06-11 MED ORDER — METOCLOPRAMIDE HCL 5 MG/ML IJ SOLN
INTRAMUSCULAR | Status: DC | PRN
  Administered 2015-06-11: 10 mg via INTRAVENOUS

## 2015-06-11 MED ORDER — BUPIVACAINE-EPINEPHRINE 0.25% -1:200000 IJ SOLN
INTRAMUSCULAR | Status: DC | PRN
  Administered 2015-06-11: 10 mL

## 2015-06-11 MED ORDER — PROPOFOL 10 MG/ML IV BOLUS
INTRAVENOUS | Status: AC
  Filled 2015-06-11: qty 20

## 2015-06-11 MED ORDER — ONDANSETRON HCL 4 MG/2ML IJ SOLN
INTRAMUSCULAR | Status: DC | PRN
  Administered 2015-06-11: 4 mg via INTRAVENOUS

## 2015-06-11 SURGICAL SUPPLY — 56 items
CABLE HIGH FREQUENCY MONO STRZ (ELECTRODE) IMPLANT
CATH FOLEY 2WAY SLVR 18FR 30CC (CATHETERS) ×2 IMPLANT
CATH ROBINSON RED A/P 16FR (CATHETERS) IMPLANT
CATH TIEMANN FOLEY 18FR 5CC (CATHETERS) ×2 IMPLANT
CHLORAPREP W/TINT 26ML (MISCELLANEOUS) ×2 IMPLANT
CLIP LIGATING HEM O LOK PURPLE (MISCELLANEOUS) ×4 IMPLANT
CLOTH BEACON ORANGE TIMEOUT ST (SAFETY) ×2 IMPLANT
COVER SURGICAL LIGHT HANDLE (MISCELLANEOUS) IMPLANT
COVER TIP SHEARS 8 DVNC (MISCELLANEOUS) ×1 IMPLANT
COVER TIP SHEARS 8MM DA VINCI (MISCELLANEOUS) ×1
CUTTER ECHEON FLEX ENDO 45 340 (ENDOMECHANICALS) ×2 IMPLANT
DECANTER SPIKE VIAL GLASS SM (MISCELLANEOUS) ×2 IMPLANT
DRAPE ARM DVNC X/XI (DISPOSABLE) ×4 IMPLANT
DRAPE CAMERA CLOSED 9X96 (DRAPES) IMPLANT
DRAPE COLUMN DVNC XI (DISPOSABLE) ×1 IMPLANT
DRAPE DA VINCI XI ARM (DISPOSABLE) ×4
DRAPE DA VINCI XI COLUMN (DISPOSABLE) ×1
DRAPE LAPAROSCOPIC ABDOMINAL (DRAPES) ×2 IMPLANT
DRAPE SURG IRRIG POUCH 19X23 (DRAPES) ×2 IMPLANT
DRSG TEGADERM 2-3/8X2-3/4 SM (GAUZE/BANDAGES/DRESSINGS) ×8 IMPLANT
DRSG TEGADERM 4X4.75 (GAUZE/BANDAGES/DRESSINGS) ×6 IMPLANT
DRSG TEGADERM 6X8 (GAUZE/BANDAGES/DRESSINGS) IMPLANT
ELECT REM PT RETURN 9FT ADLT (ELECTROSURGICAL) ×2
ELECTRODE REM PT RTRN 9FT ADLT (ELECTROSURGICAL) ×1 IMPLANT
GAUZE SPONGE 2X2 8PLY STRL LF (GAUZE/BANDAGES/DRESSINGS) ×1 IMPLANT
GLOVE BIO SURGEON STRL SZ 6.5 (GLOVE) ×2 IMPLANT
GLOVE BIOGEL M STRL SZ7.5 (GLOVE) ×6 IMPLANT
GOWN STRL REUS W/TWL LRG LVL3 (GOWN DISPOSABLE) ×4 IMPLANT
GOWN STRL REUS W/TWL XL LVL3 (GOWN DISPOSABLE) ×4 IMPLANT
HOLDER FOLEY CATH W/STRAP (MISCELLANEOUS) ×2 IMPLANT
IV LACTATED RINGERS 1000ML (IV SOLUTION) ×2 IMPLANT
KIT ACCESSORY DA VINCI DISP (KITS)
KIT ACCESSORY DVNC DISP (KITS) IMPLANT
KIT PROCEDURE DA VINCI SI (MISCELLANEOUS)
KIT PROCEDURE DVNC SI (MISCELLANEOUS) IMPLANT
LIQUID BAND (GAUZE/BANDAGES/DRESSINGS) ×2 IMPLANT
NEEDLE INSUFFLATION 14GA 120MM (NEEDLE) ×2 IMPLANT
PACK ROBOT UROLOGY CUSTOM (CUSTOM PROCEDURE TRAY) ×2 IMPLANT
PAD POSITIONING PINK XL (MISCELLANEOUS) ×2 IMPLANT
RELOAD GREEN ECHELON 45 (STAPLE) ×2 IMPLANT
SEAL CANN UNIV 5-8 DVNC XI (MISCELLANEOUS) ×4 IMPLANT
SEAL XI 5MM-8MM UNIVERSAL (MISCELLANEOUS) ×4
SET TUBE IRRIG SUCTION NO TIP (IRRIGATION / IRRIGATOR) ×2 IMPLANT
SHEET LAVH (DRAPES) IMPLANT
SOLUTION ELECTROLUBE (MISCELLANEOUS) ×2 IMPLANT
SPONGE GAUZE 2X2 STER 10/PKG (GAUZE/BANDAGES/DRESSINGS) ×1
SUT ETHILON 3 0 PS 1 (SUTURE) ×2 IMPLANT
SUT MNCRL AB 4-0 PS2 18 (SUTURE) ×4 IMPLANT
SUT VIC AB 0 CT1 27 (SUTURE) ×2
SUT VIC AB 0 CT1 27XBRD ANTBC (SUTURE) ×2 IMPLANT
SUT VICRYL 0 UR6 27IN ABS (SUTURE) ×4 IMPLANT
SUT VLOC BARB 180 ABS3/0GR12 (SUTURE) ×6
SUTURE VLOC BRB 180 ABS3/0GR12 (SUTURE) ×3 IMPLANT
TOWEL OR NON WOVEN STRL DISP B (DISPOSABLE) ×4 IMPLANT
TROCAR 12M 150ML BLUNT (TROCAR) ×2 IMPLANT
WATER STERILE IRR 1500ML POUR (IV SOLUTION) ×4 IMPLANT

## 2015-06-11 NOTE — Progress Notes (Signed)
Pt less nauseated since arrival, pain has improved. Pt still hesitant about ambulating due to nausea and pain with movement. Will retry later this evening. Pt tolerating small sips of fluids at this time. Some bleeding at JP site noted. Foley draining adequately. Urine amber in color.

## 2015-06-11 NOTE — Anesthesia Preprocedure Evaluation (Addendum)
Anesthesia Evaluation  Patient identified by MRN, date of birth, ID band Patient awake    Reviewed: Allergy & Precautions, NPO status , Patient's Chart, lab work & pertinent test results  History of Anesthesia Complications Negative for: history of anesthetic complications  Airway Mallampati: II  TM Distance: >3 FB Neck ROM: Full    Dental no notable dental hx. (+) Dental Advisory Given, Edentulous Upper, Edentulous Lower   Pulmonary former smoker,    Pulmonary exam normal breath sounds clear to auscultation       Cardiovascular negative cardio ROS Normal cardiovascular exam+ Valvular Problems/Murmurs  Rhythm:Regular Rate:Normal     Neuro/Psych negative neurological ROS  negative psych ROS   GI/Hepatic negative GI ROS, (+) Hepatitis -  Endo/Other  negative endocrine ROS  Renal/GU negative Renal ROS  negative genitourinary   Musculoskeletal negative musculoskeletal ROS (+)   Abdominal   Peds negative pediatric ROS (+)  Hematology negative hematology ROS (+)   Anesthesia Other Findings   Reproductive/Obstetrics negative OB ROS                            Anesthesia Physical Anesthesia Plan  ASA: II  Anesthesia Plan: General   Post-op Pain Management:    Induction: Intravenous  Airway Management Planned: Oral ETT  Additional Equipment:   Intra-op Plan:   Post-operative Plan: Extubation in OR  Informed Consent: I have reviewed the patients History and Physical, chart, labs and discussed the procedure including the risks, benefits and alternatives for the proposed anesthesia with the patient or authorized representative who has indicated his/her understanding and acceptance.   Dental advisory given  Plan Discussed with:   Anesthesia Plan Comments:         Anesthesia Quick Evaluation

## 2015-06-11 NOTE — Discharge Summary (Signed)
Date of admission: 06/11/2015  Date of discharge: 06/12/2015  Admission diagnosis: Prostate Cancer  Discharge diagnosis: Prostate Cancer  History and Physical: For full details, please see admission history and physical. Briefly, Roy Joyce is a 64 y.o. gentleman with localized prostate cancer.  After discussing management/treatment options, he elected to proceed with surgical treatment.  Hospital Course: [name] was taken to the operating room on 06/11/2015 and underwent a robotic assisted laparoscopic radical prostatectomy. He tolerated this procedure well and without complications. Postoperatively, he was able to be transferred to a regular hospital room following recovery from anesthesia.  He was able to begin ambulating the night of surgery. He remained hemodynamically stable overnight.  He had excellent urine output with appropriately minimal output from his pelvic drain and his pelvic drain was removed on POD #1.  He was transitioned to oral pain medication, tolerated a clear liquid diet, and had met all discharge criteria and was able to be discharged home later on POD#1.  Laboratory values:  Recent Labs  06/11/15 1241  HGB 14.3  HCT 42.6    Disposition: Home  Discharge instruction: He was instructed to be ambulatory but to refrain from heavy lifting, strenuous activity, or driving. He was instructed on urethral catheter care.  Discharge medications:     Medication List    STOP taking these medications        aspirin 81 MG tablet     cholecalciferol 400 UNITS Tabs tablet  Commonly known as:  VITAMIN D     CIALIS 20 MG tablet  Generic drug:  tadalafil     fish oil-omega-3 fatty acids 1000 MG capsule     glucosamine-chondroitin 500-400 MG tablet     meloxicam 15 MG tablet  Commonly known as:  MOBIC     multivitamin capsule     PRESERVISION AREDS 2 Caps      TAKE these medications        HYDROcodone-acetaminophen 5-325 MG per tablet  Commonly known  as:  NORCO/VICODIN  Take 1-2 tablets by mouth every 4 (four) hours as needed for moderate pain.     lovastatin 40 MG tablet  Commonly known as:  MEVACOR  Take 1 tablet (40 mg total) by mouth at bedtime.     sulfamethoxazole-trimethoprim 800-160 MG per tablet  Commonly known as:  BACTRIM DS,SEPTRA DS  Take 1 tablet by mouth 2 (two) times daily. Start taking this the day before your catheter comes out.        Followup: He will followup in 1 week for catheter removal and to discuss his surgical pathology results.

## 2015-06-11 NOTE — Transfer of Care (Signed)
Immediate Anesthesia Transfer of Care Note  Patient: Roy Joyce  Procedure(s) Performed: Procedure(s): ROBOTIC ASSISTED LAPAROSCOPIC PROSTATECTOMY WITH  (Bilateral) BILATERAL PELVIC NODE DISSECTION  (Bilateral)  Patient Location: PACU  Anesthesia Type:General  Level of Consciousness: Patient easily awoken, sedated, comfortable, cooperative, following commands, responds to stimulation.   Airway & Oxygen Therapy: Patient spontaneously breathing, ventilating well, oxygen via simple oxygen mask.  Post-op Assessment: Report given to PACU RN, vital signs reviewed and stable, moving all extremities.   Post vital signs: Reviewed and stable.  Complications: No apparent anesthesia complications

## 2015-06-11 NOTE — Op Note (Signed)
Preoperative diagnosis:  1. Prostate Cancer   Postoperative diagnosis:  1. same   Procedure: 1. Robotic assisted laparoscopic radical prostatectomy 2. Bilateral pelvic lymph node dissection  Surgeon: Ardis Hughs, MD First Assistant: Debbrah Alar, PA Resident surgeon: Jearld Adjutant, MD  Anesthesia: General  Complications: None  Intraoperative findings: normal anatomy, excellent water tight urethro-vesical anastamosis  EBL: 250  Specimens:  #1.  Prostate and seminal vesicals #2.  Bilateral pelvic lymph nodes  Indication: Roy Joyce is a 64 y.o. patient with prostate cancer.  After reviewing the management options for treatment, he elected to proceed with the removal of his prostate. We have discussed the potential benefits and risks of the procedure, side effects of the proposed treatment, the likelihood of the patient achieving the goals of the procedure, and any potential problems that might occur during the procedure or recuperation. Informed consent has been obtained.  Description of procedure:  The patient was consented in the preoperative holding area. He is in brought back to the operating room placed the table in supine position. General anesthesia was then induced and endotracheal tube was inserted. He was then placed in  placed in steep Trendelenburg. He was then prepped and draped in the routine sterile fashion. We then began by making a 8 mm incision supraumbilical midline incision the skin down through into the peritoneum. Then placed a 12 mm trocar. Inflated the abdomen and inserted the 0 robotic lens. We then placed 2 additional a 8 millimeter trochars in the patient's left lower abdomen proximally 9 cm apart and 2 trochars on the patient's right lower abdomen, one was in 8 mm trocar and the one most lateral was a 12 mm trocar which was used as the assistant port. A 5 mm trocar was placed by triangulating the 2 right lateral ports as a second assistant port.  These ports were all placed under visual guidance. Once the ports were noted to be satisfactory position the robot was docked. We started with the 0 lens, monopolar scissors and the right hand and the PK forceps the left hand as well as a fenestrated grasper as the third arm on the left-hand side.   We began our dissection the posterior plane incising the peritoneum at the level of the vas deferens. Isolated the left vas deferens and dissected it proximally towards the spermatic cord for 5 cm prior to ligating it. Then used this as traction to isolate the left the seminal vesicle which was then undressed bluntly, all vessels were cauterized with a combination of bipolar and the monopolar scissors. Once this had been dissected out laterally and posteriorly turned our attention to the anterior plane and freed the the left seminal vesicle from the surrounding tissues. We then turned our attention to the right side and similarly dissected out the right vas deferens in the right seminal vesicle. Once the SVs had been freed we turned our attention to the posterior plane and bluntly dissected the tissue between the rectum and the posterior wall of the prostate bluntly out towards the apex.    I then proceeded with the pelvic node dissection using the left ureter as the most proximal land mark and opened the posterior peritoneal fascia distally.  I dissected along the external iliac vessels down into the pelvis.  I then clipped the lymphatic channels at the apex of the nodal packet at about Cloquets node.  The tissue was then cleaned off the obturator nerve as well as the area underneath the iliac vein.  The packet was ligated at the bifurcation of the iliac vessels.  We then placed a clip on the left packet to identify it as such.  The same process was performed on the right side and the right pelvic node dissection was performed.  At this point the bladder was taken down starting at the urachal remnant with a  combination of both blunt dissection and sharp dissection with monopolar cautery the bladder was dropped down in the usual fashion to the medial umbilical ligaments laterally and the dorsal vein of the prostate anteriorly creating our space of Retzius. We then turned our attention to the endopelvic fascia which was incised laterally starting on the patient's right-hand side the levator muscles were pushed off the prostate laterally up towards the dorsal vein complex on the right-hand side. This process was then repeated on the left-hand side and a nice notch was created for the dorsal vein. I then used a 62mm stapler to staple the dorsal vein.   We then located the bladder neck at the vesicoprostatic junction and the monopolar scissors dissected down through the perivesical tissues and the bladder neck down to the prostatic urethra. The catheter was then deflated and pulled through our urethral opening and then used to retract the prostate anteriorly for the posterior bladder neck dissection. Once through the bladder neck and into the posterior plane of the prostate the SVs were brought through the opening. The left pedicle was then isolated and systematically ligated with Weck clips and scissors. The nerve bundle was then peeled off the posterior lateral aspect of the prostate and bluntly dissected away off the prostate. This was then repeated on the right side.  I then came down through the dorsal venous complex anteriorly down to the membranous urethra using the monopolar. Once down to the urethra the urethra was transected sharply and the apex of the prostate was then dissected off the levator and rectourethralis muscles. Once the apex of the prostate had been dissected free we came back to the base of the prostate and bluntly push the rectum and nerve vascular bundle off to free the prostate. Once the prostate was free, it was placed in the Endo Catch bag along with both nodal packets and the string brought  to the 5 mm port. The pelvis was then irrigated with normal saline and noted to be relatively hemostatic.   Using the 3-0 v lock suture a Rocco stitch was performed pulling the bladder neck down to the urethral stump. The vesicourethral anastomosis was then completed with 2 interlocking 3-0 V. lock sutures running the anastomosis in the 6:00 position to the 12:00 position on each side and then tying it off on the top. The final catheter was then passed through the patient's urethra and into the bladder and 120 cc was instilled into the bladder to test the anastomosis. As there was no leak a 43 [place] Blake drain was passed through the left lateral port and placed around the vesicourethral anastomosis. A 12 mm assistant port on the right lateral side was then closed with 0 Vicryl with the help of the [employer] needle. The 12 mm midline infraumbilical incision was then extended another centimeter taken down and the fascia opened to remove the Endo Catch bag with the prostate specimen. The fascia was then closed with a 0 Vicryl and all skin ports were closed with 4-0 Monocryl in a subcutaneous fashion. Dermabond glue was then applied to the incisions. The drain was then secured to the  skin with a 0 nylon stitch and dressing applied.   At the end of the case all laps needles and sponges had been accounted for. There no immediate complications. The patient returned to the PACU in stable condition.

## 2015-06-11 NOTE — H&P (Signed)
Reason For Visit Discussion of prostate cancer   History of Present Illness Patient was found to have an elevated PSA which was drawn as part of a prostate cancer screening. He has no family history of prostate cancer. The patient denies any bone pain, new back pain, or lower extremity edema. The patient denies any changes in his voiding symptoms over the last 6 months. Specifically he denies dysuria or hematuria.    PSA History:  2.0 on 2014  3.0 on 2015  4.8 on 01/2017    IPSS: The patient has a minimal voiding symptoms    SHIM: The patient states that he is able to obtain an erection 50% of the time. He uses 10 mg of Cialis which he is successful with this time.    Prostate cancer profile  Stage: T1a  PSA: 4.7  Biopsy , 2/12 cores positive: Gleason 3+4 = 7 (50%) left lateral mid, Gleason 3+4 = 7, (37%) left lateral base  Prostate volume: 40 g, no median lobe      Interval: The patient and I went over the patient's pathology report with his wife several weeks prior. At that time, I recommended that he consider either brachytherapy or radical prostatectomy for his prostate cancer. The patient presents today for further discussion of surgery.   Past Medical History Problems  1. History of cardiac murmur (Z86.79) 2. History of hepatitis (Z86.19) 3. History of hyperlipidemia (Z86.39) 4. History of skin cancer (H60.737)  Surgical History Problems  1. History of Biopsy Skin 2. History of Tonsillectomy  Current Meds 1. Aspirin 81 MG TABS;  Therapy: (Recorded:15Aug2016) to Recorded 2. Fish Oil CAPS;  Therapy: (Recorded:15Aug2016) to Recorded 3. Lovastatin 40 MG Oral Tablet;  Therapy: (Recorded:15Aug2016) to Recorded 4. Multi-Day Vitamins TABS;  Therapy: (Recorded:15Aug2016) to Recorded 5. Osteo-Bi-Flex TABS;  Therapy: (Recorded:15Aug2016) to Recorded 6. PreserVision AREDS CAPS;  Therapy: (Recorded:15Aug2016) to Recorded 7. Vitamin D3 2000 UNIT Oral  Tablet;  Therapy: (Recorded:15Aug2016) to Recorded  Allergies Medication  1. No Known Drug Allergies  Family History Problems  1. Family history of Death of family member : Mother, Father   Mother at age 9; old ageFather at age 71; lymphoma 2. Family history of hyperlipidemia (Z83.49) : Mother 3. Family history of lymphoma (Z80.2) : Father  Social History Problems  1. Alcohol use (Z78.9)   <1 2. Caffeine use (F15.90)   1 3. Former smoker [id number])   1 ppd for 33 years and quit 10+ years ago 75. Married 5. Number of children   4 sons 93. Occupation   Landscape architect  Review of Systems Genitourinary, constitutional, skin, eye, otolaryngeal, hematologic/lymphatic, cardiovascular, pulmonary, endocrine, musculoskeletal, gastrointestinal, neurological and psychiatric system(s) were reviewed and pertinent findings if present are noted and are otherwise negative.  Genitourinary: no erectile dysfunction.    Vitals Vital Signs [Data Includes: Last 1 Day]  Recorded: 15Aug2016 03:38PM  Height: 6 ft 4 in Weight: 236 lb  BMI Calculated: 28.73 BSA Calculated: 2.38 Blood Pressure: 124 / 74 Temperature: 97.7 F Heart Rate: 73  Physical Exam Constitutional: Well nourished and well developed . No acute distress.   ENT:. The ears and nose are normal in appearance.   Neck: The appearance of the neck is normal and no neck mass is present.   Pulmonary: No respiratory distress and normal respiratory rhythm and effort.   Cardiovascular: Heart rate and rhythm are normal . No peripheral edema.   Abdomen: The abdomen is soft and nontender. No masses are palpated. No CVA  tenderness. No hernias are palpable. No hepatosplenomegaly noted.   Rectal: Rectal exam demonstrates normal sphincter tone, no tenderness and no masses. The prostate has no nodularity and is not tender. The left seminal vesicle is nonpalpable. The right seminal vesicle is nonpalpable. The perineum is normal on  inspection.   Lymphatics: The femoral and inguinal nodes are not enlarged or tender.   Skin: Normal skin turgor, no visible rash and no visible skin lesions.   Neuro/Psych:. Mood and affect are appropriate.    Results/Data Urine [Data Includes: Last 1 Day]   [id number]  COLOR YELLOW   APPEARANCE CLEAR   SPECIFIC GRAVITY 1.025   pH 5.5   GLUCOSE NEGATIVE   BILIRUBIN NEGATIVE   KETONE NEGATIVE   BLOOD NEGATIVE   PROTEIN NEGATIVE   NITRITE NEGATIVE   LEUKOCYTE ESTERASE NEGATIVE    Normal UA   Assessment Assessed  1. Prostate cancer [facility])  The patient has intermediate risk prostate cancer, minimal other comorbidities. He has mild erectile dysfunction requiring 10 mg of Cialis were adequate erections, and minimal voiding symptoms.   Plan Health Maintenance  1. UA With REFLEX; [Do Not Release]; Status:Complete;   Done: [id number] 03:28PM Prostate cancer  2. Follow-up Schedule Surgery Office  Follow-up  Status: Hold For - Appointment   Requested for: [phone] 3. PT/OT Referral Referral  Referral  Status: Hold For - PreCert,Date of Service,Physical  Therapy  Requested for: 24Aug2016  Discussion/Summary We previously discussed the natural history of prostate cancer and treatment options. H&H is here to predominantly discuss surgery. We discussed prostatectomy and specifically robotic prostatectomy with bilateral pelvic lymphadenectomy being the technique that I most commonly perform. I showed the patient on their abdomen the approximately 6 small incision (trocar) sites as well as presumed extraction sites with robotic approach as well as possible open incision sites should open conversion be necessary. We discussed peri-operative risks including bleeding, infection, deep vein thrombosis, pulmonary embolism, compartment syndrome, nuropathy / neuropraxia, heart attack, stroke, death, as well as long-term risks such as non-cure / need for additional therapy. We specifically addressed  that the procedure would compromise urinary control leading to stress incontinence which typically resolves with time and pelvic rehabilitation (Kegel's, etc..), but can sometimes be permanent and require additional therapy including surgery. We also specifically addressed sexual sequellae including significant erectile dysfunction which typically partially resolves with time but can also be permanent and require additional therapy including surgery.     We discussed the typical hospital course including usual 1-2 night hospitalization, discharge with foley catheter in place usually for 1-2 weeks before voiding trial as well as usually 2 week recovery until able to perform most non-strenuous activity and 6 weeks until able to return to most jobs and more strenuous activity such as exercise.

## 2015-06-11 NOTE — Anesthesia Procedure Notes (Addendum)
Procedure Name: Intubation Date/Time: 06/11/2015 7:35 AM Performed by: Deliah Boston Pre-anesthesia Checklist: Patient identified, Emergency Drugs available, Suction available and Patient being monitored Patient Re-evaluated:Patient Re-evaluated prior to inductionOxygen Delivery Method: Circle System Utilized Preoxygenation: Pre-oxygenation with 100% oxygen Intubation Type: IV induction Ventilation: Mask ventilation without difficulty Laryngoscope Size: Mac and 4 Grade View: Grade I Tube type: Oral Number of attempts: 1 Airway Equipment and Method: Stylet and Oral airway Placement Confirmation: ETT inserted through vocal cords under direct vision,  positive ETCO2 and breath sounds checked- equal and bilateral Secured at: 22 cm Tube secured with: Tape Dental Injury: Teeth and Oropharynx as per pre-operative assessment

## 2015-06-11 NOTE — Anesthesia Postprocedure Evaluation (Signed)
  Anesthesia Post-op Note  Patient: Roy Joyce  Procedure(s) Performed: Procedure(s) (LRB): ROBOTIC ASSISTED LAPAROSCOPIC PROSTATECTOMY WITH  (Bilateral) BILATERAL PELVIC NODE DISSECTION  (Bilateral)  Patient Location: PACU  Anesthesia Type: General  Level of Consciousness: awake and alert   Airway and Oxygen Therapy: Patient Spontanous Breathing  Post-op Pain: mild  Post-op Assessment: Post-op Vital signs reviewed, Patient's Cardiovascular Status Stable, Respiratory Function Stable, Patent Airway and No signs of Nausea or vomiting  Last Vitals:  Filed Vitals:   06/11/15 1300  BP: 129/67  Pulse: 90  Temp:   Resp: 13    Post-op Vital Signs: stable   Complications: No apparent anesthesia complications

## 2015-06-11 NOTE — Discharge Instructions (Signed)

## 2015-06-12 LAB — HEMOGLOBIN AND HEMATOCRIT, BLOOD
HEMATOCRIT: 37.1 % — AB (ref 39.0–52.0)
Hemoglobin: 12.3 g/dL — ABNORMAL LOW (ref 13.0–17.0)

## 2015-06-12 LAB — BASIC METABOLIC PANEL
ANION GAP: 5 (ref 5–15)
BUN: 12 mg/dL (ref 6–20)
CHLORIDE: 104 mmol/L (ref 101–111)
CO2: 27 mmol/L (ref 22–32)
Calcium: 8.6 mg/dL — ABNORMAL LOW (ref 8.9–10.3)
Creatinine, Ser: 1.1 mg/dL (ref 0.61–1.24)
GFR calc Af Amer: >60 mL/min (ref >60)
GLUCOSE: 136 mg/dL — AB (ref 65–99)
POTASSIUM: 4.4 mmol/L (ref 3.5–5.1)
Sodium: 136 mmol/L (ref 135–145)

## 2015-06-12 MED ORDER — SENNA 8.6 MG PO TABS: 1.0000 | ORAL_TABLET | Freq: Two times a day (BID) | ORAL | Status: DC

## 2015-06-12 MED ORDER — SULFAMETHOXAZOLE-TRIMETHOPRIM 800-160 MG PO TABS: 1.0000 | ORAL_TABLET | Freq: Two times a day (BID) | ORAL | Status: DC

## 2015-06-12 MED ORDER — DOCUSATE SODIUM 100 MG PO CAPS: 100.0000 mg | ORAL_CAPSULE | Freq: Two times a day (BID) | ORAL | Status: DC

## 2015-06-12 MED ORDER — OXYBUTYNIN CHLORIDE 5 MG PO TABS: 5.0000 mg | ORAL_TABLET | Freq: Three times a day (TID) | ORAL | Status: DC | PRN

## 2015-07-18 ENCOUNTER — Encounter: Payer: Self-pay | Admitting: Family Medicine

## 2015-07-19 ENCOUNTER — Telehealth: Payer: Self-pay | Admitting: Family Medicine

## 2015-07-19 NOTE — Telephone Encounter (Signed)
Requesting refill on Lovastatin and would like for it to be sent to [facility].  Last seen: 02-16-15 Next appt: 08-18-15

## 2015-07-20 MED ORDER — LOVASTATIN 40 MG PO TABS: 40.0000 mg | ORAL_TABLET | Freq: Every day | ORAL | Status: DC

## 2015-07-20 NOTE — Telephone Encounter (Signed)
Script sent to [facility]

## 2015-07-21 NOTE — Telephone Encounter (Signed)
Please make him appointment to come follow up with Dr. Rutherford Nail

## 2015-08-18 ENCOUNTER — Encounter: Payer: Self-pay | Admitting: Family Medicine

## 2015-08-18 ENCOUNTER — Ambulatory Visit (INDEPENDENT_AMBULATORY_CARE_PROVIDER_SITE_OTHER): Payer: BLUE CROSS/BLUE SHIELD | Admitting: Family Medicine

## 2015-08-18 VITALS — BP 122/80 | HR 86 | Temp 97.3°F | Resp 18 | Ht 76.0 in | Wt 245.9 lb

## 2015-08-18 DIAGNOSIS — N529 Male erectile dysfunction, unspecified: Secondary | ICD-10-CM | POA: Insufficient documentation

## 2015-08-18 DIAGNOSIS — C61 Malignant neoplasm of prostate: Secondary | ICD-10-CM

## 2015-08-18 DIAGNOSIS — Z9079 Acquired absence of other genital organ(s): Secondary | ICD-10-CM

## 2015-08-18 DIAGNOSIS — E785 Hyperlipidemia, unspecified: Secondary | ICD-10-CM

## 2015-08-18 DIAGNOSIS — N5239 Other post-surgical erectile dysfunction: Secondary | ICD-10-CM

## 2015-08-18 NOTE — Progress Notes (Signed)
Name: Roy Joyce   MRN: [id number]    DOB: 10/29/1950   Date:08/18/2015       Progress Note  Subjective  Chief Complaint  Chief Complaint  Patient presents with  . Hyperlipidemia    6 month recheck    HPI  Hyperlipidemia  Patient has a history of hyperlipidemia for over 5 years.  Current medical regimen consist of lovastatin 40 mg daily at bedtime .  Compliance is good .  Diet and exercise are currently followed fairly well .  Risk factors for cardiovascular disease include hyperlipidemia his age .   There have been no side effects from the medication.    Erectile dysfunction  Patient has a history of prostate cancer and has use Cialis for erectile dysfunction.  Past Medical History  Diagnosis Date  . Hyperlipidemia   . Heart murmur   . Hepatitis 1962    Social History  Substance Use Topics  . Smoking status: Former Smoker    Quit date: 03/08/2005  . Smokeless tobacco: Never Used     Comment: quit 10 years  . Alcohol Use: 1.2 oz/week    2 Standard drinks or equivalent per week     Comment: ocassional     Current outpatient prescriptions:  .  lovastatin (MEVACOR) 40 MG tablet, Take 1 tablet (40 mg total) by mouth at bedtime., Disp: 30 tablet, Rfl: 5 .  CIALIS 5 MG tablet, Take 5 mg by mouth daily., Disp: , Rfl: 5 .  docusate sodium (COLACE) 100 MG capsule, Take 1 capsule (100 mg total) by mouth 2 (two) times daily. (Patient not taking: Reported on 08/18/2015), Disp: 10 capsule, Rfl: 0 .  HYDROcodone-acetaminophen (NORCO/VICODIN) 5-325 MG per tablet, Take 1-2 tablets by mouth every 4 (four) hours as needed for moderate pain. (Patient not taking: Reported on 08/18/2015), Disp: 30 tablet, Rfl: 0 .  oxybutynin (DITROPAN) 5 MG tablet, Take 1 tablet (5 mg total) by mouth every 8 (eight) hours as needed for bladder spasms. (Patient not taking: Reported on 08/18/2015), Disp: 30 tablet, Rfl: 0 .  senna (SENOKOT) 8.6 MG TABS tablet, Take 1 tablet (8.6 mg total) by mouth 2  (two) times daily. (Patient not taking: Reported on 08/18/2015), Disp: 120 each, Rfl: 0 .  sulfamethoxazole-trimethoprim (BACTRIM DS,SEPTRA DS) 800-160 MG per tablet, Take 1 tablet by mouth 2 (two) times daily. Start taking this the day before your catheter comes out. (Patient not taking: Reported on 08/18/2015), Disp: 6 tablet, Rfl: 0  No Known Allergies  Review of Systems  Constitutional: Negative for fever, chills and weight loss.  HENT: Negative for congestion, hearing loss, sore throat and tinnitus.   Eyes: Negative for blurred vision, double vision and redness.  Respiratory: Negative for cough, hemoptysis and shortness of breath.   Cardiovascular: Negative for chest pain, palpitations, orthopnea, claudication and leg swelling.  Gastrointestinal: Negative for heartburn, nausea, vomiting, diarrhea, constipation and blood in stool.  Genitourinary: Negative for dysuria, urgency, frequency and hematuria.       Erectile dysfunction  Musculoskeletal: Negative for myalgias, back pain, joint pain, falls and neck pain.  Skin: Negative for itching.  Neurological: Negative for dizziness, tingling, tremors, focal weakness, seizures, loss of consciousness, weakness and headaches.  Endo/Heme/Allergies: Does not bruise/bleed easily.  Psychiatric/Behavioral: Negative for depression and substance abuse. The patient is not nervous/anxious and does not have insomnia.      Objective  Filed Vitals:   08/18/15 0817  BP: 122/80  Pulse: 86  Temp: 97.3 F (36.3 C)  TempSrc: Oral  Resp: 18  Height: 6\' 4"  (1.93 m)  Weight: 245 lb 14.4 oz (111.54 kg)  SpO2: 97%     Physical Exam  Constitutional: He is oriented to person, place, and time and well-developed, well-nourished, and in no distress.  HENT:  Head: Normocephalic.  Eyes: EOM are normal. Pupils are equal, round, and reactive to light.  Neck: Normal range of motion. Neck supple. No thyromegaly present.  Cardiovascular: Normal rate, regular  rhythm and normal heart sounds.   No murmur heard. Pulmonary/Chest: Effort normal and breath sounds normal. No respiratory distress. He has no wheezes.  Abdominal: Soft. Bowel sounds are normal.  Genitourinary:  Per urologist  Musculoskeletal: Normal range of motion. He exhibits no edema.  Lymphadenopathy:    He has no cervical adenopathy.  Neurological: He is alert and oriented to person, place, and time. No cranial nerve deficit. Gait normal. Coordination normal.  Skin: Skin is warm and dry. No rash noted.  Psychiatric: Affect and judgment normal.      Assessment & Plan   1. Hyperlipidemia CMP and lipid panel  2. Prostate cancer [facility]) Per urologist  3. Other post-surgical erectile dysfunction Viagra when necessary  4. H/O radical prostatectomy Per urologist

## 2015-08-20 ENCOUNTER — Other Ambulatory Visit: Payer: Self-pay | Admitting: Family Medicine

## 2015-08-21 LAB — CBC WITH DIFFERENTIAL/PLATELET
BASOS ABS: 0 10*3/uL (ref 0.0–0.2)
Basos: 0 %
EOS (ABSOLUTE): 0.4 10*3/uL (ref 0.0–0.4)
EOS: 8 %
HEMATOCRIT: 42.4 % (ref 37.5–51.0)
HEMOGLOBIN: 14.7 g/dL (ref 12.6–17.7)
IMMATURE GRANS (ABS): 0 10*3/uL (ref 0.0–0.1)
Immature Granulocytes: 0 %
LYMPHS ABS: 1.9 10*3/uL (ref 0.7–3.1)
LYMPHS: 37 %
MCH: 29.5 pg (ref 26.6–33.0)
MCHC: 34.7 g/dL (ref 31.5–35.7)
MCV: 85 fL (ref 79–97)
MONOCYTES: 12 %
Monocytes Absolute: 0.6 10*3/uL (ref 0.1–0.9)
NEUTROS ABS: 2.2 10*3/uL (ref 1.4–7.0)
Neutrophils: 43 %
Platelets: 196 10*3/uL (ref 150–379)
RBC: 4.99 x10E6/uL (ref 4.14–5.80)
RDW: 14.7 % (ref 12.3–15.4)
WBC: 5.2 10*3/uL (ref 3.4–10.8)

## 2015-08-21 LAB — LIPID PANEL WITH LDL/HDL RATIO
CHOLESTEROL TOTAL: 215 mg/dL — AB (ref 100–199)
HDL: 42 mg/dL (ref >39)
LDL CALC: 126 mg/dL — AB (ref 0–99)
LDL/HDL RATIO: 3 ratio (ref 0.0–3.6)
TRIGLYCERIDES: 233 mg/dL — AB (ref 0–149)
VLDL CHOLESTEROL CAL: 47 mg/dL — AB (ref 5–40)

## 2015-08-21 LAB — THYROID CASCADE PROFILE: TSH: 2.11 u[IU]/mL (ref 0.450–4.500)

## 2016-02-03 ENCOUNTER — Telehealth: Payer: Self-pay | Admitting: Family Medicine

## 2016-02-03 NOTE — Telephone Encounter (Signed)
Patient has appointment with Dr Manuella Ghazi on 02-17-16 but is needing refill on Lovastatin. Please send to [facility]

## 2016-02-07 ENCOUNTER — Other Ambulatory Visit: Payer: Self-pay

## 2016-02-07 MED ORDER — LOVASTATIN 40 MG PO TABS: 40.0000 mg | ORAL_TABLET | Freq: Every day | ORAL | Status: DC

## 2016-02-16 ENCOUNTER — Ambulatory Visit: Payer: BLUE CROSS/BLUE SHIELD | Admitting: Family Medicine

## 2016-02-17 ENCOUNTER — Ambulatory Visit (INDEPENDENT_AMBULATORY_CARE_PROVIDER_SITE_OTHER): Payer: BLUE CROSS/BLUE SHIELD | Admitting: Family Medicine

## 2016-02-17 ENCOUNTER — Encounter: Payer: Self-pay | Admitting: Family Medicine

## 2016-02-17 ENCOUNTER — Other Ambulatory Visit: Payer: Self-pay | Admitting: Family Medicine

## 2016-02-17 VITALS — BP 122/76 | HR 70 | Temp 97.9°F | Resp 17 | Ht 76.0 in | Wt 255.6 lb

## 2016-02-17 DIAGNOSIS — Z0000 Encounter for general adult medical examination without abnormal findings: Secondary | ICD-10-CM | POA: Diagnosis not present

## 2016-02-17 NOTE — Progress Notes (Signed)
Name: Roy Joyce   MRN: [id number]    DOB: 08/13/1951   Date:02/17/2016       Progress Note  Subjective  Chief Complaint  Chief Complaint  Patient presents with  . Annual Exam    CPE    HPI  Pt. Is here for a Complete Physical Exam. He is doing well. Had a radical prostatectomy in September 2016, follows up with Urology. He believes had colonoscopy 5 years ago.   Past Medical History  Diagnosis Date  . Hyperlipidemia   . Heart murmur   . Hepatitis 1962    Past Surgical History  Procedure Laterality Date  . Tonsillectomy    . Mouth surgery    . Skin biopsy    . Robot assisted laparoscopic radical prostatectomy Bilateral 06/11/2015    Procedure: ROBOTIC ASSISTED LAPAROSCOPIC PROSTATECTOMY WITH ;  Surgeon: Ardis Hughs, MD;  Location: [facility];  Service: Urology;  Laterality: Bilateral;  . Lymphadenectomy Bilateral 06/11/2015    Procedure: BILATERAL PELVIC NODE DISSECTION ;  Surgeon: Ardis Hughs, MD;  Location: [facility];  Service: Urology;  Laterality: Bilateral;    Family History  Problem Relation Age of Onset  . Kidney cancer Neg Hx   . Lymphoma Father   . Prostate cancer Neg Hx   . Bladder Cancer Neg Hx     Social History   Social History  . Marital Status: Married    Spouse Name: N/A  . Number of Children: N/A  . Years of Education: N/A   Occupational History  . Not on file.   Social History Main Topics  . Smoking status: Former Smoker    Quit date: 03/08/2005  . Smokeless tobacco: Never Used     Comment: quit 10 years  . Alcohol Use: 1.2 oz/week    2 Standard drinks or equivalent per week     Comment: ocassional  . Drug Use: No  . Sexual Activity: Not on file   Other Topics Concern  . Not on file   Social History Narrative     Current outpatient prescriptions:  .  CIALIS 5 MG tablet, Take 5 mg by mouth daily., Disp: , Rfl: 5 .  lovastatin (MEVACOR) 40 MG tablet, Take 1 tablet (40 mg total) by mouth at bedtime., Disp: 30 tablet,  Rfl: 0  No Known Allergies   Review of Systems  Constitutional: Negative for fever, chills and weight loss.  Eyes: Negative for blurred vision.  Respiratory: Negative for cough and shortness of breath.   Cardiovascular: Negative for chest pain.  Gastrointestinal: Negative for abdominal pain, diarrhea, constipation and blood in stool.  Genitourinary: Negative for hematuria.  Musculoskeletal: Negative for back pain and neck pain.  Neurological: Negative for dizziness and headaches.  Psychiatric/Behavioral: Negative for depression. The patient is not nervous/anxious.      Objective  Filed Vitals:   02/17/16 0858  BP: 122/76  Pulse: 70  Temp: 97.9 F (36.6 C)  TempSrc: Oral  Resp: 17  Height: 6\' 4"  (1.93 m)  Weight: 255 lb 9.6 oz (115.939 kg)  SpO2: 96%    Physical Exam  Constitutional: He is oriented to person, place, and time and well-developed, well-nourished, and in no distress.  HENT:  Head: Normocephalic and atraumatic.  Eyes: Pupils are equal, round, and reactive to light.  Cardiovascular: Normal rate and regular rhythm.   Pulmonary/Chest: Effort normal and breath sounds normal.  Abdominal: Soft. Bowel sounds are normal.  Genitourinary:  Deferred.  Musculoskeletal:  Right ankle: He exhibits swelling.       Left ankle: He exhibits swelling.  Neurological: He is alert and oriented to person, place, and time.  Psychiatric: Mood, memory, affect and judgment normal.  Nursing note and vitals reviewed.    Assessment & Plan  1. Annual physical exam Obtain age-appropriate laboratory and screening studies. Ordered Cologuard for colon cancer screening. - CBC with Differential - Comprehensive Metabolic Panel (CMET) - Lipid Profile - Cologuard   [place] [facility] 02/17/2016 9:14 AM

## 2016-02-19 LAB — CBC WITH DIFFERENTIAL/PLATELET
BASOS: 1 %
Basophils Absolute: 0 10*3/uL (ref 0.0–0.2)
EOS (ABSOLUTE): 0.4 10*3/uL (ref 0.0–0.4)
Eos: 7 %
Hematocrit: 44.3 % (ref 37.5–51.0)
Hemoglobin: 14.7 g/dL (ref 12.6–17.7)
IMMATURE GRANS (ABS): 0 10*3/uL (ref 0.0–0.1)
IMMATURE GRANULOCYTES: 0 %
LYMPHS: 36 %
Lymphocytes Absolute: 1.7 10*3/uL (ref 0.7–3.1)
MCH: 28.8 pg (ref 26.6–33.0)
MCHC: 33.2 g/dL (ref 31.5–35.7)
MCV: 87 fL (ref 79–97)
Monocytes Absolute: 0.3 10*3/uL (ref 0.1–0.9)
Monocytes: 7 %
NEUTROS PCT: 49 %
Neutrophils Absolute: 2.4 10*3/uL (ref 1.4–7.0)
PLATELETS: 206 10*3/uL (ref 150–379)
RBC: 5.11 x10E6/uL (ref 4.14–5.80)
RDW: 14.2 % (ref 12.3–15.4)
WBC: 4.9 10*3/uL (ref 3.4–10.8)

## 2016-02-19 LAB — COMPREHENSIVE METABOLIC PANEL
A/G RATIO: 1.6 (ref 1.2–2.2)
ALT: 16 IU/L (ref 0–44)
AST: 17 IU/L (ref 0–40)
Albumin: 4.1 g/dL (ref 3.6–4.8)
Alkaline Phosphatase: 56 IU/L (ref 39–117)
BUN/Creatinine Ratio: 13 (ref 10–24)
BUN: 15 mg/dL (ref 8–27)
Bilirubin Total: 0.5 mg/dL (ref 0.0–1.2)
CALCIUM: 9.5 mg/dL (ref 8.6–10.2)
CO2: 25 mmol/L (ref 18–29)
CREATININE: 1.13 mg/dL (ref 0.76–1.27)
Chloride: 102 mmol/L (ref 96–106)
GFR, EST AFRICAN AMERICAN: 79 mL/min/{1.73_m2} (ref >59)
GFR, EST NON AFRICAN AMERICAN: 68 mL/min/{1.73_m2} (ref >59)
GLUCOSE: 106 mg/dL — AB (ref 65–99)
Globulin, Total: 2.5 g/dL (ref 1.5–4.5)
Potassium: 4.6 mmol/L (ref 3.5–5.2)
Sodium: 140 mmol/L (ref 134–144)
TOTAL PROTEIN: 6.6 g/dL (ref 6.0–8.5)

## 2016-02-19 LAB — LIPID PANEL
CHOL/HDL RATIO: 3.7 ratio (ref 0.0–5.0)
Cholesterol, Total: 146 mg/dL (ref 100–199)
HDL: 39 mg/dL — AB (ref >39)
LDL CALC: 76 mg/dL (ref 0–99)
Triglycerides: 153 mg/dL — ABNORMAL HIGH (ref 0–149)
VLDL CHOLESTEROL CAL: 31 mg/dL (ref 5–40)

## 2016-03-07 ENCOUNTER — Telehealth: Payer: Self-pay

## 2016-03-07 MED ORDER — LOVASTATIN 40 MG PO TABS: 40.0000 mg | ORAL_TABLET | Freq: Every day | ORAL | Status: DC

## 2016-03-07 NOTE — Telephone Encounter (Signed)
Medication has been refilled and sent to [facility] 

## 2016-04-19 LAB — COLOGUARD: Cologuard: NEGATIVE

## 2016-05-30 ENCOUNTER — Telehealth: Payer: Self-pay | Admitting: Family Medicine

## 2016-05-30 DIAGNOSIS — E785 Hyperlipidemia, unspecified: Secondary | ICD-10-CM

## 2016-05-30 MED ORDER — LOVASTATIN 40 MG PO TABS: 40.0000 mg | ORAL_TABLET | Freq: Every day | ORAL | 0 refills | Status: DC

## 2016-05-30 NOTE — Telephone Encounter (Signed)
Patient verbally informed that prescription has been sent to pharmacy

## 2016-08-29 ENCOUNTER — Ambulatory Visit (INDEPENDENT_AMBULATORY_CARE_PROVIDER_SITE_OTHER): Payer: BLUE CROSS/BLUE SHIELD | Admitting: Family Medicine

## 2016-08-29 ENCOUNTER — Encounter: Payer: Self-pay | Admitting: Family Medicine

## 2016-08-29 DIAGNOSIS — E785 Hyperlipidemia, unspecified: Secondary | ICD-10-CM

## 2016-08-29 LAB — LIPID PANEL
CHOLESTEROL: 209 mg/dL — AB (ref <200)
HDL: 44 mg/dL (ref >40)
LDL CALC: 119 mg/dL — AB (ref <100)
Total CHOL/HDL Ratio: 4.8 Ratio (ref <5.0)
Triglycerides: 231 mg/dL — ABNORMAL HIGH (ref <150)
VLDL: 46 mg/dL — AB (ref <30)

## 2016-08-29 MED ORDER — LOVASTATIN 40 MG PO TABS: 40.0000 mg | ORAL_TABLET | Freq: Every day | ORAL | 1 refills | Status: DC

## 2016-08-29 NOTE — Progress Notes (Signed)
Name: Roy Joyce   MRN: [id number]    DOB: [birth year]   Date:08/29/2016       Progress Note  Subjective  Chief Complaint  Chief Complaint  Patient presents with  . Medication Refill    lovastatin    Hyperlipidemia  This is a chronic problem. The problem is uncontrolled (elevated Triglycerides.). Recent lipid tests were reviewed and are normal. Pertinent negatives include no myalgias or shortness of breath. Current antihyperlipidemic treatment includes statins. The current treatment provides significant improvement of lipids.    Past Medical History:  Diagnosis Date  . Heart murmur   . Hepatitis 1962  . Hyperlipidemia     Past Surgical History:  Procedure Laterality Date  . LYMPHADENECTOMY Bilateral 06/11/2015   Procedure: BILATERAL PELVIC NODE DISSECTION ;  Surgeon: Ardis Hughs, MD;  Location: [facility];  Service: Urology;  Laterality: Bilateral;  . MOUTH SURGERY    . ROBOT ASSISTED LAPAROSCOPIC RADICAL PROSTATECTOMY Bilateral 06/11/2015   Procedure: ROBOTIC ASSISTED LAPAROSCOPIC PROSTATECTOMY WITH ;  Surgeon: Ardis Hughs, MD;  Location: [facility];  Service: Urology;  Laterality: Bilateral;  . SKIN BIOPSY    . TONSILLECTOMY      Family History  Problem Relation Age of Onset  . Lymphoma Father   . Kidney cancer Neg Hx   . Prostate cancer Neg Hx   . Bladder Cancer Neg Hx     Social History   Social History  . Marital status: Married    Spouse name: N/A  . Number of children: N/A  . Years of education: N/A   Occupational History  . Not on file.   Social History Main Topics  . Smoking status: Former Smoker    Quit date: 03/08/2005  . Smokeless tobacco: Never Used     Comment: quit 10 years  . Alcohol use 1.2 oz/week    2 Standard drinks or equivalent per week     Comment: ocassional  . Drug use: No  . Sexual activity: Not on file   Other Topics Concern  . Not on file   Social History Narrative  . No narrative on file     Current Outpatient  Prescriptions:  .  CIALIS 5 MG tablet, Take 5 mg by mouth daily., Disp: , Rfl: 5 .  lovastatin (MEVACOR) 40 MG tablet, Take 1 tablet (40 mg total) by mouth at bedtime., Disp: 90 tablet, Rfl: 0  No Known Allergies   Review of Systems  Respiratory: Negative for shortness of breath.   Musculoskeletal: Negative for myalgias.      Objective  Vitals:   08/29/16 0846  BP: 135/83  Pulse: 71  Resp: 16  Temp: 97.8 F (36.6 C)  TempSrc: Oral  SpO2: 96%  Weight: 261 lb 4.8 oz (118.5 kg)  Height: 6\' 4"  (1.93 m)    Physical Exam  Constitutional: He is oriented to person, place, and time and well-developed, well-nourished, and in no distress.  HENT:  Head: Normocephalic and atraumatic.  Cardiovascular: Normal rate and regular rhythm.   Pulmonary/Chest: Effort normal and breath sounds normal.  Abdominal: Bowel sounds are normal.  Musculoskeletal:       Right ankle: He exhibits no swelling.       Left ankle: He exhibits no swelling.  Neurological: He is alert and oriented to person, place, and time.  Psychiatric: Mood, memory, affect and judgment normal.  Nursing note and vitals reviewed.      Assessment & Plan  1. Hyperlipidemia, unspecified hyperlipidemia type Elevated  triglycerides, continue on lovastatin, recheck FLP and adjust as necessary - lovastatin (MEVACOR) 40 MG tablet; Take 1 tablet (40 mg total) by mouth at bedtime.  Dispense: 90 tablet; Refill: 1 - Lipid Profile   Roy Joyce A[facility] 08/29/2016 8:50 AM

## 2016-09-12 ENCOUNTER — Ambulatory Visit: Payer: BLUE CROSS/BLUE SHIELD | Admitting: Family Medicine

## 2016-09-19 ENCOUNTER — Ambulatory Visit (INDEPENDENT_AMBULATORY_CARE_PROVIDER_SITE_OTHER): Payer: BLUE CROSS/BLUE SHIELD | Admitting: Family Medicine

## 2016-09-19 VITALS — BP 124/72 | HR 105 | Temp 97.7°F | Resp 16 | Ht 76.0 in | Wt 262.1 lb

## 2016-09-19 DIAGNOSIS — E785 Hyperlipidemia, unspecified: Secondary | ICD-10-CM | POA: Diagnosis not present

## 2016-09-19 DIAGNOSIS — R739 Hyperglycemia, unspecified: Secondary | ICD-10-CM

## 2016-09-19 LAB — POCT GLYCOSYLATED HEMOGLOBIN (HGB A1C): HEMOGLOBIN A1C: 5.6

## 2016-09-19 MED ORDER — ROSUVASTATIN CALCIUM 10 MG PO TABS: 10.0000 mg | ORAL_TABLET | Freq: Every day | ORAL | 1 refills | Status: DC

## 2016-09-19 NOTE — Progress Notes (Signed)
Name: Roy Joyce   MRN: [id number]    DOB: [birth year]   Date:09/19/2016       Progress Note  Subjective  Chief Complaint  Chief Complaint  Patient presents with  . Hyperlipidemia    to discuss cholesterol medication    Hyperlipidemia  This is a chronic problem. The problem is uncontrolled. Recent lipid tests were reviewed and are high. Pertinent negatives include no leg pain, myalgias or shortness of breath. Current antihyperlipidemic treatment includes statins. Risk factors for coronary artery disease include diabetes mellitus and dyslipidemia.    Past Medical History:  Diagnosis Date  . Heart murmur   . Hepatitis 1962  . Hyperlipidemia     Past Surgical History:  Procedure Laterality Date  . LYMPHADENECTOMY Bilateral 06/11/2015   Procedure: BILATERAL PELVIC NODE DISSECTION ;  Surgeon: Ardis Hughs, MD;  Location: [facility];  Service: Urology;  Laterality: Bilateral;  . MOUTH SURGERY    . ROBOT ASSISTED LAPAROSCOPIC RADICAL PROSTATECTOMY Bilateral 06/11/2015   Procedure: ROBOTIC ASSISTED LAPAROSCOPIC PROSTATECTOMY WITH ;  Surgeon: Ardis Hughs, MD;  Location: [facility];  Service: Urology;  Laterality: Bilateral;  . SKIN BIOPSY    . TONSILLECTOMY      Family History  Problem Relation Age of Onset  . Lymphoma Father   . Kidney cancer Neg Hx   . Prostate cancer Neg Hx   . Bladder Cancer Neg Hx     Social History   Social History  . Marital status: Married    Spouse name: N/A  . Number of children: N/A  . Years of education: N/A   Occupational History  . Not on file.   Social History Main Topics  . Smoking status: Former Smoker    Quit date: 03/08/2005  . Smokeless tobacco: Never Used     Comment: quit 10 years  . Alcohol use 1.2 oz/week    2 Standard drinks or equivalent per week     Comment: ocassional  . Drug use: No  . Sexual activity: Not on file   Other Topics Concern  . Not on file   Social History Narrative  . No narrative on file      Current Outpatient Prescriptions:  .  CIALIS 5 MG tablet, Take 5 mg by mouth daily., Disp: , Rfl: 5 .  lovastatin (MEVACOR) 40 MG tablet, Take 1 tablet (40 mg total) by mouth at bedtime., Disp: 90 tablet, Rfl: 1  No Known Allergies   Review of Systems  Respiratory: Negative for shortness of breath.   Musculoskeletal: Negative for myalgias.     Objective  Vitals:   09/19/16 1057  BP: 124/72  Pulse: (!) 105  Resp: 16  Temp: 97.7 F (36.5 C)  TempSrc: Oral  SpO2: 96%  Weight: 262 lb 2 oz (118.9 kg)  Height: 6\' 4"  (1.93 m)    Physical Exam  Constitutional: He is oriented to person, place, and time and well-developed, well-nourished, and in no distress.  HENT:  Head: Normocephalic and atraumatic.  Cardiovascular: Normal rate and regular rhythm.   Pulmonary/Chest: Effort normal and breath sounds normal.  Abdominal: Bowel sounds are normal.  Musculoskeletal:       Right ankle: He exhibits no swelling.       Left ankle: He exhibits no swelling.  Neurological: He is alert and oriented to person, place, and time.  Psychiatric: Mood, memory, affect and judgment normal.  Nursing note and vitals reviewed.     Recent Results (from the past 2160  hour(s))  Lipid Profile     Status: Abnormal   Collection Time: 08/29/16  9:04 AM  Result Value Ref Range   Cholesterol 209 (H) <200 mg/dL    Comment: ** Please note change in reference range(s). **      Triglycerides 231 (H) <150 mg/dL    Comment: ** Please note change in reference range(s). **      HDL 44 >40 mg/dL    Comment: ** Please note change in reference range(s). **      Total CHOL/HDL Ratio 4.8 <5.0 Ratio   VLDL 46 (H) <30 mg/dL   LDL Cholesterol 119 (H) <100 mg/dL    Comment: ** Please note change in reference range(s). **        Assessment & Plan  1. Hyperglycemia  A1c is 5.6%, considered normal. Patient does not have diabetes - POCT HgB A1C  2. Hyperlipidemia, unspecified hyperlipidemia  type Started on rosuvastatin 10 mg at bedtime for optimal control of hyperlipidemia. Recheck FLP in 3 months - rosuvastatin (CRESTOR) 10 MG tablet; Take 1 tablet (10 mg total) by mouth daily.  Dispense: 90 tablet; Refill: 1   Roy Joyce [facility]ad [facility] 09/19/2016 11:15 AM

## 2016-12-19 ENCOUNTER — Encounter: Payer: Self-pay | Admitting: Family Medicine

## 2017-02-27 ENCOUNTER — Ambulatory Visit: Payer: BLUE CROSS/BLUE SHIELD | Admitting: Family Medicine

## 2018-06-19 ENCOUNTER — Encounter: Payer: Self-pay | Admitting: Radiation Oncology

## 2018-07-03 ENCOUNTER — Encounter: Payer: Self-pay | Admitting: Radiation Oncology

## 2018-07-09 ENCOUNTER — Encounter: Payer: Self-pay | Admitting: Radiation Oncology

## 2018-07-09 NOTE — Progress Notes (Signed)
GU Location of Tumor / Histology: biochemical recurrent prostatic adenocarcinoma  If Prostate Cancer, Gleason Score is (3 + 4) and PSA is (4.82) pretreatment.  Roy Joyce had a radical prosttecotmy on 06/11/2015. Surgical margins negative. Nodes negative.       Past/Anticipated interventions by urology, if any: prostatectomy, PSA surveillance, referral to radiation oncology for consideration of salvage therapy  Past/Anticipated interventions by medical oncology, if any: no  Weight changes, if any: no  Bowel/Bladder complaints, if any: Reports urinary leakage s/p prostatectomy has resolved. Denies dysuria or hematuria. IPSS 2. SHIM 9.   Nausea/Vomiting, if any: no  Pain issues, if any:  Yes related to age  SAFETY ISSUES:  Prior radiation? no  Pacemaker/ICD? no  Possible current pregnancy? no  Is the patient on methotrexate? no  Current Complaints / other details:  67 year old male. Married. Works in Product/process development scientist. NKDA. Former smoker. Annual CT revealed concerning lymph nodes that need biopsy.

## 2018-07-10 ENCOUNTER — Ambulatory Visit
Admission: RE | Admit: 2018-07-10 | Discharge: 2018-07-10 | Disposition: A | Discharge status: Home / Self Care | Payer: Managed Care, Other (non HMO) | Source: Ambulatory Visit | Attending: Radiation Oncology | Admitting: Radiation Oncology

## 2018-07-10 ENCOUNTER — Encounter: Payer: Self-pay | Admitting: Radiation Oncology

## 2018-07-10 ENCOUNTER — Other Ambulatory Visit: Payer: Self-pay

## 2018-07-10 ENCOUNTER — Ambulatory Visit: Payer: BLUE CROSS/BLUE SHIELD | Admitting: Radiation Oncology

## 2018-07-10 VITALS — BP 144/75 | HR 75 | Temp 98.2°F | Resp 20 | Ht 76.0 in | Wt 262.8 lb

## 2018-07-10 DIAGNOSIS — Z87891 Personal history of nicotine dependence: Secondary | ICD-10-CM | POA: Insufficient documentation

## 2018-07-10 DIAGNOSIS — Z7982 Long term (current) use of aspirin: Secondary | ICD-10-CM | POA: Diagnosis not present

## 2018-07-10 DIAGNOSIS — C61 Malignant neoplasm of prostate: Secondary | ICD-10-CM | POA: Diagnosis not present

## 2018-07-10 DIAGNOSIS — Z9079 Acquired absence of other genital organ(s): Secondary | ICD-10-CM | POA: Insufficient documentation

## 2018-07-10 DIAGNOSIS — Z79899 Other long term (current) drug therapy: Secondary | ICD-10-CM | POA: Diagnosis not present

## 2018-07-10 HISTORY — DX: Malignant neoplasm of prostate: C61

## 2018-07-10 NOTE — Progress Notes (Signed)
Radiation Oncology         [phone]) [phone] ________________________________  Initial outpatient Consultation  Name: Roy Joyce MRN: [id number]  Date: 07/10/2018  DOB: 1951/01/15  CC:Yevette Edwards, MD  Ardis Hughs, MD   REFERRING PHYSICIAN: Ardis Hughs, MD  DIAGNOSIS: 67 y.o. 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    ICD-10-CM   1. Malignant neoplasm of prostate ([place]) C61     HISTORY OF PRESENT ILLNESS: Rithwik Schmieg is a 67 y.o. male with a diagnosis of biochemically recurrent prostate cancer. The patient has been under the care of Dr. Louis Meckel since 2016. 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 Surgical pathology demonstrated negative margins and no lymph node involvement or extracapsular extension. His initial post treatment PSA was undetectable but began gradually rising shortly thereafter.  He has been followed with close monitoring of his PSA every 3 months with his most recent PSA on 06/06/18 at 0.20.  Of note, the patient was recently found to have a new left perinephric mass found incidentally on a follow up CT scan on 06/21/18. 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 He did have an abdominal MRI as well from 2018. On recent repeat imaging, the mesenteric panniculitis appeared stable; however, he has developed three new areas in Gerota's fascia in the left perirenal space and unclear etiology if these are concerning for potential malignancy.  Biopsy has been recommended  The patient has kindly been referred today for discussion of potential salvage radiation treatment options.  He is accompanied by his wife today.   PREVIOUS RADIATION THERAPY: No  PAST MEDICAL HISTORY:  Past Medical  History:  Diagnosis Date  . Heart murmur   . Hepatitis 1962  . Hyperlipidemia   . Prostate cancer ([place])       PAST SURGICAL HISTORY: Past Surgical History:  Procedure Laterality Date  . LYMPHADENECTOMY Bilateral 06/11/2015   Procedure: BILATERAL PELVIC NODE DISSECTION ;  Surgeon: Ardis Hughs, MD;  Location: [facility];  Service: Urology;  Laterality: Bilateral;  . MOUTH SURGERY    . ROBOT ASSISTED LAPAROSCOPIC RADICAL PROSTATECTOMY Bilateral 06/11/2015   Procedure: ROBOTIC ASSISTED LAPAROSCOPIC PROSTATECTOMY WITH ;  Surgeon: Ardis Hughs, MD;  Location: [facility];  Service: Urology;  Laterality: Bilateral;  . SKIN BIOPSY    . TONSILLECTOMY      FAMILY HISTORY:  Family History  Problem Relation Age of Onset  . Lymphoma Father        dairy farmer/crops/pesticides  . Kidney cancer Neg Hx   . Prostate cancer Neg Hx   . Bladder Cancer Neg Hx   . Breast cancer Neg Hx   . Pancreatic cancer Neg Hx   . Colon cancer Neg Hx     SOCIAL HISTORY:  Social History   Socioeconomic History  . Marital status: Married    Spouse name: Not on file  . Number of children: Not on file  . Years of education: Not on file  . Highest education level: Not on file  Occupational History  . Not on file  Social Needs  . Financial resource strain: Not on file  . Food insecurity:    Worry: Not on file    Inability: Not on file  .  Transportation needs:    Medical: Not on file    Non-medical: Not on file  Tobacco Use  . Smoking status: Former Smoker    Packs/day: 0.50    Years: 30.00    Pack years: 15.00    Types: Cigarettes    Last attempt to quit: 03/08/2005    Years since quitting: 13.3  . Smokeless tobacco: Never Used  . Tobacco comment: quit 10 years/camel non filters  Substance and Sexual Activity  . Alcohol use: Yes    Alcohol/week: 2.0 standard drinks    Types: 2 Standard drinks or equivalent per week    Comment: ocassional  . Drug use: No  . Sexual activity: Yes    Comment:  with aid of Trimix and vaccum device  Lifestyle  . Physical activity:    Days per week: Not on file    Minutes per session: Not on file  . Stress: Not on file  Relationships  . Social connections:    Talks on phone: Not on file    Gets together: Not on file    Attends religious service: Not on file    Active member of club or organization: Not on file    Attends meetings of clubs or organizations: Not on file    Relationship status: Not on file  . Intimate partner violence:    Fear of current or ex partner: Not on file    Emotionally abused: Not on file    Physically abused: Not on file    Forced sexual activity: Not on file  Other Topics Concern  . Not on file  Social History Narrative  . Not on file    ALLERGIES: Patient has no known allergies.  MEDICATIONS:  Current Outpatient Medications  Medication Sig Dispense Refill  . ASPIRIN 81 PO aspirin 81 mg tablet  Take by oral route.    . Cholecalciferol (VITAMIN D-3) 1000 units CAPS Take by mouth.    . CIALIS 5 MG tablet Take 5 mg by mouth daily.  5  . Misc Natural Products (OSTEO BI-FLEX JOINT SHIELD PO) Take by mouth.    . Omega-3 Fatty Acids (FISH OIL) 1000 MG CAPS Fish Oil    . rosuvastatin (CRESTOR) 10 MG tablet rosuvastatin 10 mg tablet     No current facility-administered medications for this encounter.     REVIEW OF SYSTEMS:  On review of systems, the patient reports that he is doing well overall. He denies any chest pain, shortness of breath, cough, fevers, chills, night sweats, unintended weight changes. He denies any bowel disturbances, and denies abdominal pain, nausea or vomiting. He denies any new musculoskeletal or joint aches or pains. His IPSS was 2, indicating mild urinary symptoms. His SHIM was 9, indicating he does have erectile dysfunction. A complete review of systems is obtained and is otherwise negative.    PHYSICAL EXAM:  Wt Readings from Last 3 Encounters:  07/10/18 262 lb 12.8 oz (119.2 kg)    09/19/16 262 lb 2 oz (118.9 kg)  08/29/16 261 lb 4.8 oz (118.5 kg)   Temp Readings from Last 3 Encounters:  07/10/18 98.2 F (36.8 C) (Oral)  09/19/16 97.7 F (36.5 C) (Oral)  08/29/16 97.8 F (36.6 C) (Oral)   BP Readings from Last 3 Encounters:  07/10/18 (!) 144/75  09/19/16 124/72  08/29/16 135/83   Pulse Readings from Last 3 Encounters:  07/10/18 75  09/19/16 (!) 105  08/29/16 71   Pain Assessment Pain Score: 0-No pain/10  In  general this is a well appearing Caucasian gentleman in no acute distress. He is alert and oriented x4 and appropriate throughout the examination. HEENT reveals that the patient is normocephalic, atraumatic. EOMs are intact. PERRLA. Skin is intact without any evidence of gross lesions. Cardiovascular exam reveals a regular rate and rhythm, no clicks rubs or murmurs are auscultated. Chest is clear to auscultation bilaterally. Lymphatic assessment is performed and does not reveal any adenopathy in the cervical, supraclavicular, axillary, or inguinal chains. Abdomen has active bowel sounds in all quadrants and is intact. The abdomen is soft, non tender, non distended. Lower extremities are negative for deep calf tenderness, cyanosis or clubbing. 2+ Pitting edema to left lower extremity from ankle to foot.    KPS = 100  100 - Normal; no complaints; no evidence of disease. 90   - Able to carry on normal activity; minor signs or symptoms of disease. 80   - Normal activity with effort; some signs or symptoms of disease. 30   - Cares for self; unable to carry on normal activity or to do active work. 60   - Requires occasional assistance, but is able to care for most of his personal needs. 50   - Requires considerable assistance and frequent medical care. 82   - Disabled; requires special care and assistance. 28   - Severely disabled; hospital admission is indicated although death not imminent. 52   - Very sick; hospital admission necessary; active supportive  treatment necessary. 10   - Moribund; fatal processes progressing rapidly. 0     - Dead  Karnofsky DA, Abelmann [place], Craver LS and Burchenal [facility] [phone]) The use of the nitrogen mustards in the palliative treatment of carcinoma: with particular reference to bronchogenic carcinoma Cancer 1 634-56  LABORATORY DATA:  Lab Results  Component Value Date   WBC 4.9 02/18/2016   HGB 14.7 02/18/2016   HCT 44.3 02/18/2016   MCV 87 02/18/2016   PLT 206 02/18/2016   Lab Results  Component Value Date   NA 140 02/18/2016   K 4.6 02/18/2016   CL 102 02/18/2016   CO2 25 02/18/2016   Lab Results  Component Value Date   ALT 16 02/18/2016   AST 17 02/18/2016   ALKPHOS 56 02/18/2016   BILITOT 0.5 02/18/2016     RADIOGRAPHY: No results found.    IMPRESSION/PLAN: 1. 67 y.o. 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  Today we reviewed the findings and workup thus far.  We discussed the natural history of prostate cancer.  We reviewed the the implications of rising, detectable PSA post-prostatectomy indicating biochemical recurrence of his disease. We discussed radiation treatment directed to the prostatic fossa with regard to the logistics and delivery of external beam radiation treatment.  We discussed and outlined the risks, benefits, short and long-term effects associated with radiotherapy.  The recommendation is to proceed with a 7.5 week course of daily radiotherapy to the prostate fossa with curative intent.  He was encouraged to ask questions that were answered to his stated satisfaction.  We discussed the importance of proceeding with biopsy and workup of newly found left perinephric mass prior to proceeding with salvage radiation therapy to the prostate fossa. We will share our findings with Dr. Louis Meckel and will look forward to following along in his progress. Pending findings on  his upcoming biopsy with general surgery, we  will proceed with treatment planning accordingly.     We enjoyed meeting with him today, and look forward to participating in the care of this very nice gentleman.   We spent 60 minutes face to face with the patient and more than 50% of that time was spent in counseling and/or coordination of care.    Nicholos Johns, PA-C    Tyler Pita, MD  [facility] Direct Dial: [phone]  Fax: [phone] [email or web address]  Skype  [email or web address]   This document serves as a record of services personally performed by Tyler Pita, MD and Freeman Caldron, PA-C. It was created on their behalf by Wilburn Mylar, a trained medical scribe. The creation of this record is based on the scribe's personal observations and the provider's statements to them. This document has been checked and approved by the attending provider.

## 2018-07-10 NOTE — Progress Notes (Signed)
See progress note under physician encounter. 

## 2018-07-15 ENCOUNTER — Ambulatory Visit: Payer: BLUE CROSS/BLUE SHIELD

## 2018-07-15 ENCOUNTER — Ambulatory Visit: Payer: BLUE CROSS/BLUE SHIELD | Admitting: Radiation Oncology

## 2018-08-01 ENCOUNTER — Encounter: Payer: Self-pay | Admitting: Urology

## 2018-08-01 NOTE — Progress Notes (Signed)
Per Dr. Louis Meckel, this patient has been cleared to move forward with salvage radiotherapy to the prostate fossa.  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  I have forwarded an email request to CT SIM to please reach out to the patient to schedule CT SIM in the near future.   Nicholos Johns, MMS, PA-C [facility]: [phone]  Fax: [phone]

## 2018-08-08 ENCOUNTER — Encounter: Payer: Self-pay | Admitting: Medical Oncology

## 2018-08-08 ENCOUNTER — Ambulatory Visit
Admission: RE | Admit: 2018-08-08 | Discharge: 2018-08-08 | Disposition: A | Discharge status: Home / Self Care | Payer: Managed Care, Other (non HMO) | Source: Ambulatory Visit | Attending: Radiation Oncology | Admitting: Radiation Oncology

## 2018-09-12 ENCOUNTER — Ambulatory Visit
Admission: RE | Admit: 2018-09-12 | Discharge: 2018-09-12 | Disposition: A | Discharge status: Home / Self Care | Payer: Managed Care, Other (non HMO) | Source: Ambulatory Visit | Attending: Radiation Oncology | Admitting: Radiation Oncology

## 2018-09-12 DIAGNOSIS — C61 Malignant neoplasm of prostate: Secondary | ICD-10-CM | POA: Diagnosis not present

## 2018-09-12 NOTE — Progress Notes (Signed)
  Radiation Oncology         [phone] ________________________________  Name: Roy Joyce MRN: [id number]  Date: 09/12/2018  DOB: May 17, 1951  SIMULATION AND TREATMENT PLANNING NOTE    ICD-10-CM   1. Prostate cancer ([place]) C61     DIAGNOSIS:  67 y.o. 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  NARRATIVE:  The patient was brought to the [place].  Identity was confirmed.  All relevant records and images related to the planned course of therapy were reviewed.  The patient freely provided informed written consent to proceed with treatment after reviewing the details related to the planned course of therapy. The consent form was witnessed and verified by the simulation staff.  Then, the patient was set-up in a stable reproducible supine position for radiation therapy.  A vacuum lock pillow device was custom fabricated to position his legs in a reproducible immobilized position.  Then, I performed a urethrogram under sterile conditions to identify the prostatic bed.  CT images were obtained.  Surface markings were placed.  The CT images were loaded into the planning software.  Then the prostate bed target, pelvic lymph node target and avoidance structures including the rectum, bladder, bowel and hips were contoured.  Treatment planning then occurred.  The radiation prescription was entered and confirmed.  A total of one complex treatment devices were fabricated. I have requested : Intensity Modulated Radiotherapy (IMRT) is medically necessary for this case for the following reason:  Rectal sparing.Marland Kitchen  PLAN:  The patient will receive 45 Gy in 25 fractions of 1.8 Gy, followed by a boost to the prostate bed to a total dose of 68.4 Gy with 13 additional fractions of 1.8 Gy.   ________________________________  Sheral Apley Tammi Klippel, M.D.   This document serves as  a record of services personally performed by Tyler Pita, MD. It was created on his behalf by Wilburn Mylar, a trained medical scribe. The creation of this record is based on the scribe's personal observations and the provider's statements to them. This document has been checked and approved by the attending provider.

## 2018-09-20 DIAGNOSIS — C61 Malignant neoplasm of prostate: Secondary | ICD-10-CM | POA: Diagnosis not present

## 2018-09-26 ENCOUNTER — Ambulatory Visit
Admission: RE | Admit: 2018-09-26 | Discharge: 2018-09-26 | Disposition: A | Discharge status: Home / Self Care | Payer: Managed Care, Other (non HMO) | Source: Ambulatory Visit | Attending: Radiation Oncology | Admitting: Radiation Oncology

## 2018-09-26 ENCOUNTER — Encounter: Payer: Self-pay | Admitting: Medical Oncology

## 2018-09-26 DIAGNOSIS — C61 Malignant neoplasm of prostate: Secondary | ICD-10-CM | POA: Diagnosis not present

## 2018-09-27 ENCOUNTER — Ambulatory Visit
Admission: RE | Admit: 2018-09-27 | Discharge: 2018-09-27 | Disposition: A | Discharge status: Home / Self Care | Payer: Managed Care, Other (non HMO) | Source: Ambulatory Visit | Attending: Radiation Oncology | Admitting: Radiation Oncology

## 2018-09-27 DIAGNOSIS — C61 Malignant neoplasm of prostate: Secondary | ICD-10-CM | POA: Diagnosis not present

## 2018-09-30 ENCOUNTER — Ambulatory Visit
Admission: RE | Admit: 2018-09-30 | Discharge: 2018-09-30 | Disposition: A | Discharge status: Home / Self Care | Payer: Managed Care, Other (non HMO) | Source: Ambulatory Visit | Attending: Radiation Oncology | Admitting: Radiation Oncology

## 2018-09-30 DIAGNOSIS — C61 Malignant neoplasm of prostate: Secondary | ICD-10-CM | POA: Diagnosis not present

## 2018-10-01 ENCOUNTER — Ambulatory Visit
Admission: RE | Admit: 2018-10-01 | Discharge: 2018-10-01 | Disposition: A | Discharge status: Home / Self Care | Payer: Managed Care, Other (non HMO) | Source: Ambulatory Visit | Attending: Radiation Oncology | Admitting: Radiation Oncology

## 2018-10-01 DIAGNOSIS — C61 Malignant neoplasm of prostate: Secondary | ICD-10-CM | POA: Diagnosis not present

## 2018-10-02 ENCOUNTER — Ambulatory Visit
Admission: RE | Admit: 2018-10-02 | Discharge: 2018-10-02 | Disposition: A | Discharge status: Home / Self Care | Payer: Managed Care, Other (non HMO) | Source: Ambulatory Visit | Attending: Radiation Oncology | Admitting: Radiation Oncology

## 2018-10-02 DIAGNOSIS — C61 Malignant neoplasm of prostate: Secondary | ICD-10-CM | POA: Diagnosis not present

## 2018-10-03 ENCOUNTER — Ambulatory Visit
Admission: RE | Admit: 2018-10-03 | Discharge: 2018-10-03 | Disposition: A | Discharge status: Home / Self Care | Payer: Managed Care, Other (non HMO) | Source: Ambulatory Visit | Attending: Radiation Oncology | Admitting: Radiation Oncology

## 2018-10-03 DIAGNOSIS — C61 Malignant neoplasm of prostate: Secondary | ICD-10-CM | POA: Diagnosis not present

## 2018-10-04 ENCOUNTER — Ambulatory Visit
Admission: RE | Admit: 2018-10-04 | Discharge: 2018-10-04 | Disposition: A | Discharge status: Home / Self Care | Payer: Managed Care, Other (non HMO) | Source: Ambulatory Visit | Attending: Radiation Oncology | Admitting: Radiation Oncology

## 2018-10-04 DIAGNOSIS — C61 Malignant neoplasm of prostate: Secondary | ICD-10-CM | POA: Diagnosis not present

## 2018-10-07 ENCOUNTER — Ambulatory Visit
Admission: RE | Admit: 2018-10-07 | Discharge: 2018-10-07 | Disposition: A | Discharge status: Home / Self Care | Payer: Managed Care, Other (non HMO) | Source: Ambulatory Visit | Attending: Radiation Oncology | Admitting: Radiation Oncology

## 2018-10-07 DIAGNOSIS — C61 Malignant neoplasm of prostate: Secondary | ICD-10-CM | POA: Diagnosis not present

## 2018-10-08 ENCOUNTER — Ambulatory Visit
Admission: RE | Admit: 2018-10-08 | Discharge: 2018-10-08 | Disposition: A | Discharge status: Home / Self Care | Payer: Managed Care, Other (non HMO) | Source: Ambulatory Visit | Attending: Radiation Oncology | Admitting: Radiation Oncology

## 2018-10-08 DIAGNOSIS — C61 Malignant neoplasm of prostate: Secondary | ICD-10-CM | POA: Diagnosis not present

## 2018-10-09 ENCOUNTER — Ambulatory Visit
Admission: RE | Admit: 2018-10-09 | Discharge: 2018-10-09 | Disposition: A | Discharge status: Home / Self Care | Payer: Managed Care, Other (non HMO) | Source: Ambulatory Visit | Attending: Radiation Oncology | Admitting: Radiation Oncology

## 2018-10-09 DIAGNOSIS — C61 Malignant neoplasm of prostate: Secondary | ICD-10-CM | POA: Diagnosis not present

## 2018-10-10 ENCOUNTER — Ambulatory Visit
Admission: RE | Admit: 2018-10-10 | Discharge: 2018-10-10 | Disposition: A | Discharge status: Home / Self Care | Payer: Managed Care, Other (non HMO) | Source: Ambulatory Visit | Attending: Radiation Oncology | Admitting: Radiation Oncology

## 2018-10-10 DIAGNOSIS — C61 Malignant neoplasm of prostate: Secondary | ICD-10-CM | POA: Diagnosis not present

## 2018-10-11 ENCOUNTER — Ambulatory Visit
Admission: RE | Admit: 2018-10-11 | Discharge: 2018-10-11 | Disposition: A | Discharge status: Home / Self Care | Payer: Managed Care, Other (non HMO) | Source: Ambulatory Visit | Attending: Radiation Oncology | Admitting: Radiation Oncology

## 2018-10-11 DIAGNOSIS — C61 Malignant neoplasm of prostate: Secondary | ICD-10-CM | POA: Diagnosis not present

## 2018-10-14 ENCOUNTER — Ambulatory Visit
Admission: RE | Admit: 2018-10-14 | Discharge: 2018-10-14 | Disposition: A | Discharge status: Home / Self Care | Payer: Managed Care, Other (non HMO) | Source: Ambulatory Visit | Attending: Radiation Oncology | Admitting: Radiation Oncology

## 2018-10-14 DIAGNOSIS — C61 Malignant neoplasm of prostate: Secondary | ICD-10-CM | POA: Diagnosis not present

## 2018-10-15 ENCOUNTER — Ambulatory Visit
Admission: RE | Admit: 2018-10-15 | Discharge: 2018-10-15 | Disposition: A | Discharge status: Home / Self Care | Payer: Managed Care, Other (non HMO) | Source: Ambulatory Visit | Attending: Radiation Oncology | Admitting: Radiation Oncology

## 2018-10-15 DIAGNOSIS — C61 Malignant neoplasm of prostate: Secondary | ICD-10-CM | POA: Diagnosis not present

## 2018-10-16 ENCOUNTER — Ambulatory Visit
Admission: RE | Admit: 2018-10-16 | Discharge: 2018-10-16 | Disposition: A | Discharge status: Home / Self Care | Payer: Managed Care, Other (non HMO) | Source: Ambulatory Visit | Attending: Radiation Oncology | Admitting: Radiation Oncology

## 2018-10-16 DIAGNOSIS — C61 Malignant neoplasm of prostate: Secondary | ICD-10-CM | POA: Diagnosis not present

## 2018-10-17 ENCOUNTER — Ambulatory Visit
Admission: RE | Admit: 2018-10-17 | Discharge: 2018-10-17 | Disposition: A | Discharge status: Home / Self Care | Payer: Managed Care, Other (non HMO) | Source: Ambulatory Visit | Attending: Radiation Oncology | Admitting: Radiation Oncology

## 2018-10-17 DIAGNOSIS — C61 Malignant neoplasm of prostate: Secondary | ICD-10-CM | POA: Diagnosis not present

## 2018-10-18 ENCOUNTER — Ambulatory Visit
Admission: RE | Admit: 2018-10-18 | Discharge: 2018-10-18 | Disposition: A | Discharge status: Home / Self Care | Payer: Managed Care, Other (non HMO) | Source: Ambulatory Visit | Attending: Radiation Oncology | Admitting: Radiation Oncology

## 2018-10-18 DIAGNOSIS — C61 Malignant neoplasm of prostate: Secondary | ICD-10-CM | POA: Diagnosis not present

## 2018-10-21 ENCOUNTER — Ambulatory Visit
Admission: RE | Admit: 2018-10-21 | Discharge: 2018-10-21 | Disposition: A | Discharge status: Home / Self Care | Payer: Managed Care, Other (non HMO) | Source: Ambulatory Visit | Attending: Radiation Oncology | Admitting: Radiation Oncology

## 2018-10-21 DIAGNOSIS — C61 Malignant neoplasm of prostate: Secondary | ICD-10-CM | POA: Diagnosis not present

## 2018-10-22 ENCOUNTER — Ambulatory Visit
Admission: RE | Admit: 2018-10-22 | Discharge: 2018-10-22 | Disposition: A | Discharge status: Home / Self Care | Payer: Managed Care, Other (non HMO) | Source: Ambulatory Visit | Attending: Radiation Oncology | Admitting: Radiation Oncology

## 2018-10-22 DIAGNOSIS — C61 Malignant neoplasm of prostate: Secondary | ICD-10-CM | POA: Diagnosis not present

## 2018-10-23 ENCOUNTER — Ambulatory Visit: Payer: Managed Care, Other (non HMO)

## 2018-10-24 ENCOUNTER — Ambulatory Visit
Admission: RE | Admit: 2018-10-24 | Discharge: 2018-10-24 | Disposition: A | Discharge status: Home / Self Care | Payer: Managed Care, Other (non HMO) | Source: Ambulatory Visit | Attending: Radiation Oncology | Admitting: Radiation Oncology

## 2018-10-24 DIAGNOSIS — C61 Malignant neoplasm of prostate: Secondary | ICD-10-CM | POA: Diagnosis not present

## 2018-10-25 ENCOUNTER — Ambulatory Visit
Admission: RE | Admit: 2018-10-25 | Discharge: 2018-10-25 | Disposition: A | Discharge status: Home / Self Care | Payer: Managed Care, Other (non HMO) | Source: Ambulatory Visit | Attending: Radiation Oncology | Admitting: Radiation Oncology

## 2018-10-25 DIAGNOSIS — C61 Malignant neoplasm of prostate: Secondary | ICD-10-CM | POA: Diagnosis not present

## 2018-10-28 ENCOUNTER — Ambulatory Visit
Admission: RE | Admit: 2018-10-28 | Discharge: 2018-10-28 | Disposition: A | Discharge status: Home / Self Care | Payer: Managed Care, Other (non HMO) | Source: Ambulatory Visit | Attending: Radiation Oncology | Admitting: Radiation Oncology

## 2018-10-28 DIAGNOSIS — C61 Malignant neoplasm of prostate: Secondary | ICD-10-CM | POA: Insufficient documentation

## 2018-10-29 ENCOUNTER — Ambulatory Visit
Admission: RE | Admit: 2018-10-29 | Discharge: 2018-10-29 | Disposition: A | Discharge status: Home / Self Care | Payer: Managed Care, Other (non HMO) | Source: Ambulatory Visit | Attending: Radiation Oncology | Admitting: Radiation Oncology

## 2018-10-29 DIAGNOSIS — C61 Malignant neoplasm of prostate: Secondary | ICD-10-CM | POA: Diagnosis not present

## 2018-10-30 ENCOUNTER — Ambulatory Visit
Admission: RE | Admit: 2018-10-30 | Discharge: 2018-10-30 | Disposition: A | Discharge status: Home / Self Care | Payer: Managed Care, Other (non HMO) | Source: Ambulatory Visit | Attending: Radiation Oncology | Admitting: Radiation Oncology

## 2018-10-30 DIAGNOSIS — C61 Malignant neoplasm of prostate: Secondary | ICD-10-CM | POA: Diagnosis not present

## 2018-10-31 ENCOUNTER — Ambulatory Visit
Admission: RE | Admit: 2018-10-31 | Discharge: 2018-10-31 | Disposition: A | Discharge status: Home / Self Care | Payer: Managed Care, Other (non HMO) | Source: Ambulatory Visit | Attending: Radiation Oncology | Admitting: Radiation Oncology

## 2018-10-31 ENCOUNTER — Ambulatory Visit: Payer: Managed Care, Other (non HMO)

## 2018-10-31 DIAGNOSIS — C61 Malignant neoplasm of prostate: Secondary | ICD-10-CM | POA: Diagnosis not present

## 2018-11-01 ENCOUNTER — Other Ambulatory Visit: Payer: Self-pay | Admitting: Radiation Oncology

## 2018-11-01 ENCOUNTER — Ambulatory Visit
Admission: RE | Admit: 2018-11-01 | Discharge: 2018-11-01 | Disposition: A | Discharge status: Home / Self Care | Payer: Managed Care, Other (non HMO) | Source: Ambulatory Visit | Attending: Radiation Oncology | Admitting: Radiation Oncology

## 2018-11-01 ENCOUNTER — Ambulatory Visit: Payer: Managed Care, Other (non HMO)

## 2018-11-01 DIAGNOSIS — C61 Malignant neoplasm of prostate: Secondary | ICD-10-CM | POA: Diagnosis not present

## 2018-11-01 MED ORDER — PROCHLORPERAZINE MALEATE 10 MG PO TABS: 10.0000 mg | ORAL_TABLET | Freq: Four times a day (QID) | ORAL | 5 refills | Status: DC | PRN

## 2018-11-04 ENCOUNTER — Ambulatory Visit
Admission: RE | Admit: 2018-11-04 | Discharge: 2018-11-04 | Disposition: A | Discharge status: Home / Self Care | Payer: Managed Care, Other (non HMO) | Source: Ambulatory Visit | Attending: Radiation Oncology | Admitting: Radiation Oncology

## 2018-11-04 DIAGNOSIS — C61 Malignant neoplasm of prostate: Secondary | ICD-10-CM | POA: Diagnosis not present

## 2018-11-05 ENCOUNTER — Ambulatory Visit
Admission: RE | Admit: 2018-11-05 | Discharge: 2018-11-05 | Disposition: A | Discharge status: Home / Self Care | Payer: Managed Care, Other (non HMO) | Source: Ambulatory Visit | Attending: Radiation Oncology | Admitting: Radiation Oncology

## 2018-11-05 DIAGNOSIS — C61 Malignant neoplasm of prostate: Secondary | ICD-10-CM | POA: Diagnosis not present

## 2018-11-06 ENCOUNTER — Ambulatory Visit
Admission: RE | Admit: 2018-11-06 | Discharge: 2018-11-06 | Disposition: A | Discharge status: Home / Self Care | Payer: Managed Care, Other (non HMO) | Source: Ambulatory Visit | Attending: Radiation Oncology | Admitting: Radiation Oncology

## 2018-11-06 DIAGNOSIS — C61 Malignant neoplasm of prostate: Secondary | ICD-10-CM | POA: Diagnosis not present

## 2018-11-07 ENCOUNTER — Ambulatory Visit
Admission: RE | Admit: 2018-11-07 | Discharge: 2018-11-07 | Disposition: A | Discharge status: Home / Self Care | Payer: Managed Care, Other (non HMO) | Source: Ambulatory Visit | Attending: Radiation Oncology | Admitting: Radiation Oncology

## 2018-11-07 DIAGNOSIS — C61 Malignant neoplasm of prostate: Secondary | ICD-10-CM | POA: Diagnosis not present

## 2018-11-08 ENCOUNTER — Other Ambulatory Visit: Payer: Self-pay | Admitting: Radiation Oncology

## 2018-11-08 ENCOUNTER — Ambulatory Visit
Admission: RE | Admit: 2018-11-08 | Discharge: 2018-11-08 | Disposition: A | Discharge status: Home / Self Care | Payer: Managed Care, Other (non HMO) | Source: Ambulatory Visit | Attending: Radiation Oncology | Admitting: Radiation Oncology

## 2018-11-08 DIAGNOSIS — E860 Dehydration: Secondary | ICD-10-CM

## 2018-11-08 DIAGNOSIS — C61 Malignant neoplasm of prostate: Secondary | ICD-10-CM

## 2018-11-08 DIAGNOSIS — R634 Abnormal weight loss: Secondary | ICD-10-CM

## 2018-11-08 DIAGNOSIS — R1900 Intra-abdominal and pelvic swelling, mass and lump, unspecified site: Secondary | ICD-10-CM

## 2018-11-08 DIAGNOSIS — R112 Nausea with vomiting, unspecified: Secondary | ICD-10-CM

## 2018-11-08 MED ORDER — DRONABINOL 2.5 MG PO CAPS: 2.5000 mg | ORAL_CAPSULE | Freq: Two times a day (BID) | ORAL | 0 refills | Status: DC

## 2018-11-08 MED ORDER — DRONABINOL 2.5 MG PO CAPS: 2.5000 mg | ORAL_CAPSULE | Freq: Two times a day (BID) | ORAL | 1 refills | Status: DC

## 2018-11-11 ENCOUNTER — Telehealth: Payer: Self-pay | Admitting: Radiation Oncology

## 2018-11-11 ENCOUNTER — Ambulatory Visit
Admission: RE | Admit: 2018-11-11 | Discharge: 2018-11-11 | Disposition: A | Discharge status: Home / Self Care | Payer: Managed Care, Other (non HMO) | Source: Ambulatory Visit | Attending: Radiation Oncology | Admitting: Radiation Oncology

## 2018-11-11 DIAGNOSIS — C61 Malignant neoplasm of prostate: Secondary | ICD-10-CM | POA: Diagnosis not present

## 2018-11-11 NOTE — Telephone Encounter (Signed)
Received voicemail message from [facility] in the pharmacy at [facility] requesting a return call. Phoned back. Provided height and need for peripheral IV. Provided verbal order for fluids. Faxed copy of most recent med list to [phone]. Fax confirmation of delivery obtained. [facility]ney reports they plan to administer the patient's first course of fluids tomorrow afternoon.

## 2018-11-12 ENCOUNTER — Telehealth: Payer: Self-pay | Admitting: *Deleted

## 2018-11-12 ENCOUNTER — Ambulatory Visit
Admission: RE | Admit: 2018-11-12 | Discharge: 2018-11-12 | Disposition: A | Discharge status: Home / Self Care | Payer: Managed Care, Other (non HMO) | Source: Ambulatory Visit | Attending: Radiation Oncology | Admitting: Radiation Oncology

## 2018-11-12 NOTE — Telephone Encounter (Signed)
Called patient to inform of CT for 11/14/18 - arrival time - 1:15 pm @ [facility], pt. to be NPO- 4 hrs. prior to test, pt. to pick -up prep no later than 11/13/18 for test, spoke with patient and he is aware of this test

## 2018-11-13 ENCOUNTER — Ambulatory Visit
Admission: RE | Admit: 2018-11-13 | Discharge: 2018-11-13 | Disposition: A | Discharge status: Home / Self Care | Payer: Managed Care, Other (non HMO) | Source: Ambulatory Visit | Attending: Radiation Oncology | Admitting: Radiation Oncology

## 2018-11-14 ENCOUNTER — Ambulatory Visit (HOSPITAL_COMMUNITY)
Admission: RE | Admit: 2018-11-14 | Discharge: 2018-11-14 | Disposition: A | Discharge status: Home / Self Care | Payer: Managed Care, Other (non HMO) | Source: Ambulatory Visit | Attending: Radiation Oncology | Admitting: Radiation Oncology

## 2018-11-14 ENCOUNTER — Ambulatory Visit
Admission: RE | Admit: 2018-11-14 | Discharge: 2018-11-14 | Disposition: A | Discharge status: Home / Self Care | Payer: Managed Care, Other (non HMO) | Source: Ambulatory Visit | Attending: Radiation Oncology | Admitting: Radiation Oncology

## 2018-11-14 ENCOUNTER — Encounter (HOSPITAL_COMMUNITY): Payer: Self-pay

## 2018-11-14 DIAGNOSIS — R1900 Intra-abdominal and pelvic swelling, mass and lump, unspecified site: Secondary | ICD-10-CM | POA: Insufficient documentation

## 2018-11-15 ENCOUNTER — Encounter (HOSPITAL_COMMUNITY): Payer: Self-pay | Admitting: Internal Medicine

## 2018-11-15 ENCOUNTER — Inpatient Hospital Stay (HOSPITAL_COMMUNITY): Payer: Managed Care, Other (non HMO)

## 2018-11-15 ENCOUNTER — Ambulatory Visit
Admission: RE | Admit: 2018-11-15 | Payer: Managed Care, Other (non HMO) | Source: Ambulatory Visit | Attending: Radiation Oncology | Admitting: Radiation Oncology

## 2018-11-15 ENCOUNTER — Ambulatory Visit
Admission: RE | Admit: 2018-11-15 | Discharge: 2018-11-15 | Disposition: A | Discharge status: Home / Self Care | Payer: Managed Care, Other (non HMO) | Source: Ambulatory Visit | Attending: Radiation Oncology | Admitting: Radiation Oncology

## 2018-11-15 ENCOUNTER — Telehealth: Payer: Self-pay | Admitting: Family Medicine

## 2018-11-15 ENCOUNTER — Encounter: Payer: Self-pay | Admitting: Medical Oncology

## 2018-11-15 ENCOUNTER — Other Ambulatory Visit: Payer: Self-pay | Admitting: Radiation Oncology

## 2018-11-15 ENCOUNTER — Inpatient Hospital Stay (HOSPITAL_COMMUNITY)
Admission: EM | Admit: 2018-11-15 | Discharge: 2018-11-20 | DRG: 392 | Disposition: A | Discharge status: Home / Self Care | Payer: Managed Care, Other (non HMO) | Source: Ambulatory Visit | Attending: Internal Medicine | Admitting: Internal Medicine

## 2018-11-15 DIAGNOSIS — D649 Anemia, unspecified: Secondary | ICD-10-CM | POA: Diagnosis present

## 2018-11-15 DIAGNOSIS — D519 Vitamin B12 deficiency anemia, unspecified: Secondary | ICD-10-CM | POA: Diagnosis not present

## 2018-11-15 DIAGNOSIS — K210 Gastro-esophageal reflux disease with esophagitis: Secondary | ICD-10-CM | POA: Diagnosis present

## 2018-11-15 DIAGNOSIS — Z6827 Body mass index (BMI) 27.0-27.9, adult: Secondary | ICD-10-CM

## 2018-11-15 DIAGNOSIS — Z87891 Personal history of nicotine dependence: Secondary | ICD-10-CM

## 2018-11-15 DIAGNOSIS — N179 Acute kidney failure, unspecified: Secondary | ICD-10-CM | POA: Diagnosis present

## 2018-11-15 DIAGNOSIS — I951 Orthostatic hypotension: Secondary | ICD-10-CM | POA: Diagnosis not present

## 2018-11-15 DIAGNOSIS — R112 Nausea with vomiting, unspecified: Secondary | ICD-10-CM | POA: Diagnosis not present

## 2018-11-15 DIAGNOSIS — K2970 Gastritis, unspecified, without bleeding: Secondary | ICD-10-CM | POA: Diagnosis present

## 2018-11-15 DIAGNOSIS — E538 Deficiency of other specified B group vitamins: Secondary | ICD-10-CM | POA: Diagnosis present

## 2018-11-15 DIAGNOSIS — E785 Hyperlipidemia, unspecified: Secondary | ICD-10-CM

## 2018-11-15 DIAGNOSIS — R627 Adult failure to thrive: Secondary | ICD-10-CM

## 2018-11-15 DIAGNOSIS — D630 Anemia in neoplastic disease: Secondary | ICD-10-CM | POA: Diagnosis present

## 2018-11-15 DIAGNOSIS — E876 Hypokalemia: Secondary | ICD-10-CM | POA: Diagnosis not present

## 2018-11-15 DIAGNOSIS — R634 Abnormal weight loss: Secondary | ICD-10-CM

## 2018-11-15 DIAGNOSIS — E860 Dehydration: Secondary | ICD-10-CM | POA: Diagnosis present

## 2018-11-15 DIAGNOSIS — C61 Malignant neoplasm of prostate: Secondary | ICD-10-CM

## 2018-11-15 DIAGNOSIS — Z9079 Acquired absence of other genital organ(s): Secondary | ICD-10-CM | POA: Diagnosis not present

## 2018-11-15 DIAGNOSIS — R509 Fever, unspecified: Secondary | ICD-10-CM | POA: Diagnosis not present

## 2018-11-15 DIAGNOSIS — Z923 Personal history of irradiation: Secondary | ICD-10-CM | POA: Diagnosis not present

## 2018-11-15 DIAGNOSIS — R7989 Other specified abnormal findings of blood chemistry: Secondary | ICD-10-CM | POA: Diagnosis present

## 2018-11-15 DIAGNOSIS — Z79899 Other long term (current) drug therapy: Secondary | ICD-10-CM | POA: Diagnosis not present

## 2018-11-15 DIAGNOSIS — R161 Splenomegaly, not elsewhere classified: Secondary | ICD-10-CM

## 2018-11-15 LAB — COMPREHENSIVE METABOLIC PANEL
ALT: 128 U/L — AB (ref 0–44)
AST: 75 U/L — ABNORMAL HIGH (ref 15–41)
Albumin: 2.3 g/dL — ABNORMAL LOW (ref 3.5–5.0)
Alkaline Phosphatase: 387 U/L — ABNORMAL HIGH (ref 38–126)
Anion gap: 10 (ref 5–15)
BUN: 31 mg/dL — ABNORMAL HIGH (ref 8–23)
CALCIUM: 8.4 mg/dL — AB (ref 8.9–10.3)
CO2: 21 mmol/L — ABNORMAL LOW (ref 22–32)
CREATININE: 2.43 mg/dL — AB (ref 0.61–1.24)
Chloride: 107 mmol/L (ref 98–111)
GFR calc Af Amer: 31 mL/min — ABNORMAL LOW (ref >60)
GFR calc non Af Amer: 27 mL/min — ABNORMAL LOW (ref >60)
Glucose, Bld: 120 mg/dL — ABNORMAL HIGH (ref 70–99)
Potassium: 3.4 mmol/L — ABNORMAL LOW (ref 3.5–5.1)
Sodium: 138 mmol/L (ref 135–145)
TOTAL PROTEIN: 6.3 g/dL — AB (ref 6.5–8.1)
Total Bilirubin: 0.8 mg/dL (ref 0.3–1.2)

## 2018-11-15 LAB — CBC WITH DIFFERENTIAL/PLATELET
Abs Immature Granulocytes: 0.04 10*3/uL (ref 0.00–0.07)
Basophils Absolute: 0 10*3/uL (ref 0.0–0.1)
Basophils Relative: 0 %
EOS PCT: 1 %
Eosinophils Absolute: 0 10*3/uL (ref 0.0–0.5)
HCT: 30.1 % — ABNORMAL LOW (ref 39.0–52.0)
Hemoglobin: 9 g/dL — ABNORMAL LOW (ref 13.0–17.0)
Immature Granulocytes: 1 %
Lymphocytes Relative: 11 %
Lymphs Abs: 0.6 10*3/uL — ABNORMAL LOW (ref 0.7–4.0)
MCH: 25.9 pg — ABNORMAL LOW (ref 26.0–34.0)
MCHC: 29.9 g/dL — ABNORMAL LOW (ref 30.0–36.0)
MCV: 86.7 fL (ref 80.0–100.0)
Monocytes Absolute: 0.3 10*3/uL (ref 0.1–1.0)
Monocytes Relative: 5 %
Neutro Abs: 4.9 10*3/uL (ref 1.7–7.7)
Neutrophils Relative %: 82 %
Platelets: 276 10*3/uL (ref 150–400)
RBC: 3.47 MIL/uL — ABNORMAL LOW (ref 4.22–5.81)
RDW: 17.1 % — ABNORMAL HIGH (ref 11.5–15.5)
WBC: 5.8 10*3/uL (ref 4.0–10.5)
nRBC: 0 % (ref 0.0–0.2)

## 2018-11-15 LAB — URINALYSIS, COMPLETE (UACMP) WITH MICROSCOPIC
Bacteria, UA: NONE SEEN
Bilirubin Urine: NEGATIVE
Glucose, UA: NEGATIVE mg/dL
Ketones, ur: NEGATIVE mg/dL
Leukocytes,Ua: NEGATIVE
Nitrite: NEGATIVE
PROTEIN: 30 mg/dL — AB
Specific Gravity, Urine: 1.009 (ref 1.005–1.030)
pH: 5 (ref 5.0–8.0)

## 2018-11-15 LAB — PHOSPHORUS: Phosphorus: 3.9 mg/dL (ref 2.5–4.6)

## 2018-11-15 LAB — MAGNESIUM: Magnesium: 1.8 mg/dL (ref 1.7–2.4)

## 2018-11-15 LAB — SODIUM, URINE, RANDOM: Sodium, Ur: 33 mmol/L

## 2018-11-15 LAB — TSH: TSH: 1.224 u[IU]/mL (ref 0.350–4.500)

## 2018-11-15 LAB — CREATININE, URINE, RANDOM: Creatinine, Urine: 94.79 mg/dL

## 2018-11-15 MED ORDER — ACETAMINOPHEN 650 MG RE SUPP: 650.0000 mg | Freq: Four times a day (QID) | RECTAL | Status: DC | PRN

## 2018-11-15 MED ORDER — ONDANSETRON HCL 4 MG/2ML IJ SOLN
4.0000 mg | Freq: Four times a day (QID) | INTRAMUSCULAR | Status: DC | PRN
  Administered 2018-11-16 – 2018-11-18 (×2): 4 mg via INTRAVENOUS
  Filled 2018-11-15 (×2): qty 2

## 2018-11-15 MED ORDER — POTASSIUM CHLORIDE CRYS ER 20 MEQ PO TBCR
40.0000 meq | EXTENDED_RELEASE_TABLET | Freq: Once | ORAL | Status: AC
  Administered 2018-11-15: 40 meq via ORAL
  Filled 2018-11-15: qty 2

## 2018-11-15 MED ORDER — SORBITOL 70 % SOLN: 30.0000 mL | Freq: Every day | Status: DC | PRN

## 2018-11-15 MED ORDER — MAGNESIUM CITRATE PO SOLN: 1.0000 | Freq: Once | ORAL | Status: DC | PRN

## 2018-11-15 MED ORDER — PROSIGHT PO TABS
1.0000 | ORAL_TABLET | Freq: Two times a day (BID) | ORAL | Status: DC
  Administered 2018-11-15 – 2018-11-20 (×10): 1 via ORAL
  Filled 2018-11-15 (×10): qty 1

## 2018-11-15 MED ORDER — SODIUM CHLORIDE 0.9 % IV SOLN
INTRAVENOUS | Status: DC
  Administered 2018-11-15 – 2018-11-17 (×6): via INTRAVENOUS
  Administered 2018-11-19: 50 mL via INTRAVENOUS

## 2018-11-15 MED ORDER — KETOROLAC TROMETHAMINE 30 MG/ML IJ SOLN: 30.0000 mg | Freq: Four times a day (QID) | INTRAMUSCULAR | Status: DC | PRN

## 2018-11-15 MED ORDER — ASPIRIN 81 MG PO CHEW
81.0000 mg | CHEWABLE_TABLET | Freq: Every day | ORAL | Status: DC
  Administered 2018-11-16 – 2018-11-20 (×5): 81 mg via ORAL
  Filled 2018-11-15 (×5): qty 1

## 2018-11-15 MED ORDER — PROCHLORPERAZINE EDISYLATE 10 MG/2ML IJ SOLN: 10.0000 mg | Freq: Four times a day (QID) | INTRAMUSCULAR | Status: DC | PRN

## 2018-11-15 MED ORDER — SODIUM CHLORIDE 0.9% FLUSH
3.0000 mL | Freq: Two times a day (BID) | INTRAVENOUS | Status: DC
  Administered 2018-11-18 (×2): 3 mL via INTRAVENOUS

## 2018-11-15 MED ORDER — PRESERVISION AREDS 2+MULTI VIT PO CAPS: ORAL_CAPSULE | Freq: Two times a day (BID) | ORAL | Status: DC

## 2018-11-15 MED ORDER — ENOXAPARIN SODIUM 40 MG/0.4ML ~~LOC~~ SOLN
40.0000 mg | SUBCUTANEOUS | Status: DC
  Administered 2018-11-15 – 2018-11-19 (×5): 40 mg via SUBCUTANEOUS
  Filled 2018-11-15 (×5): qty 0.4

## 2018-11-15 MED ORDER — MAGNESIUM SULFATE 4 GM/100ML IV SOLN
4.0000 g | Freq: Once | INTRAVENOUS | Status: AC
  Administered 2018-11-15: 4 g via INTRAVENOUS
  Filled 2018-11-15: qty 100

## 2018-11-15 MED ORDER — ACETAMINOPHEN 325 MG PO TABS: 650.0000 mg | ORAL_TABLET | Freq: Four times a day (QID) | ORAL | Status: DC | PRN

## 2018-11-15 MED ORDER — SODIUM CHLORIDE 0.9 % IV BOLUS
1000.0000 mL | Freq: Once | INTRAVENOUS | Status: AC
  Administered 2018-11-15: 1000 mL via INTRAVENOUS

## 2018-11-15 MED ORDER — POLYETHYLENE GLYCOL 3350 17 G PO PACK: 17.0000 g | PACK | Freq: Every day | ORAL | Status: DC | PRN

## 2018-11-15 MED ORDER — LEVALBUTEROL HCL 0.63 MG/3ML IN NEBU: 0.6300 mg | INHALATION_SOLUTION | Freq: Four times a day (QID) | RESPIRATORY_TRACT | Status: DC | PRN

## 2018-11-15 MED ORDER — TRAMADOL HCL 50 MG PO TABS: 50.0000 mg | ORAL_TABLET | Freq: Four times a day (QID) | ORAL | Status: DC | PRN

## 2018-11-15 MED ORDER — ONDANSETRON HCL 4 MG PO TABS: 4.0000 mg | ORAL_TABLET | Freq: Four times a day (QID) | ORAL | Status: DC | PRN

## 2018-11-15 NOTE — H&P (Signed)
History and Physical    Roy Joyce ZYS:[id number] DOB: 18-Jan-1951 DOA: 11/15/2018  PCP: Yevette Edwards, MD  Patient coming from: Radiation oncologist office.  I have personally briefly reviewed patient's old medical records in [place]  Chief Complaint: Failure to thrive/intractable nausea vomiting  HPI: Roy Joyce is a 68 y.o. male with medical history significant of hyperlipidemia, hepatitis noted during childhood in the 1960s, prostate cancer status post prostatectomy 2016 undergoing radiation treatment to prostate fossa per Dr. Tammi Klippel since early January 2020 who had presented to radiation clinic and noted to have failure to thrive, 40 pound weight loss over a month, noted to be orthostatic hypotension and tachycardia. Patient states has been having intractable nausea and vomiting x3 weeks with decreased oral intake and wife states coughing when drinking clear liquids, decreased appetite, chills, diarrhea fluctuating between loose and mushy stools.  Patient does endorse fever with temperature of 10 1-102.5 daily over the past 10 days usually happen in the evenings, feels winded on minimal exertion.  Patient states has been getting a liter of IV fluids daily for the past 3 days of D5 normal saline.  Patient denies any abdominal pain, no shortness of breath, no dizziness, no lightheadedness, no syncope, no dysuria, no melena, no hematemesis, no hematochezia.  Patient denies any recent antibiotic use.  No laxatives.  Patient with some complaints of right-sided headache which improved after hydration with IV fluids.  Patient denies any urinary hesitancy or urgency.  Patient states stool has a malodorous smell to it.  ED Course: Patient transferred as a direct admission from radiation oncologist office.  Review of Systems: As per HPI otherwise 10 point review of systems negative.   Past Medical History:  Diagnosis Date  . Heart murmur   . Hepatitis 1962  . Hyperlipidemia     . Prostate cancer [facility])     Past Surgical History:  Procedure Laterality Date  . LYMPHADENECTOMY Bilateral 06/11/2015   Procedure: BILATERAL PELVIC NODE DISSECTION ;  Surgeon: Ardis Hughs, MD;  Location: [facility];  Service: Urology;  Laterality: Bilateral;  . MOUTH SURGERY    . ROBOT ASSISTED LAPAROSCOPIC RADICAL PROSTATECTOMY Bilateral 06/11/2015   Procedure: ROBOTIC ASSISTED LAPAROSCOPIC PROSTATECTOMY WITH ;  Surgeon: Ardis Hughs, MD;  Location: [facility];  Service: Urology;  Laterality: Bilateral;  . SKIN BIOPSY    . TONSILLECTOMY       reports that he quit smoking about 13 years ago. His smoking use included cigarettes. He has a 15.00 pack-year smoking history. He has never used smokeless tobacco. He reports current alcohol use of about 2.0 standard drinks of alcohol per week. He reports that he does not use drugs.  No Known Allergies  Family History  Problem Relation Age of Onset  . Lymphoma Father        dairy farmer/crops/pesticides  . Kidney cancer Neg Hx   . Prostate cancer Neg Hx   . Bladder Cancer Neg Hx   . Breast cancer Neg Hx   . Pancreatic cancer Neg Hx   . Colon cancer Neg Hx    Father deceased age 60 from [place].  Mother deceased age 55 from old age approximately 2 years ago.  Prior to Admission medications   Medication Sig Start Date End Date Taking? Authorizing Provider  acetaminophen (TYLENOL) 500 MG tablet Take 500-1,000 mg by mouth every 6 (six) hours as needed for mild pain or fever.   Yes [provider]  ASPIRIN 81 PO Take 81  mg by mouth daily.    Yes [provider]  Multiple Vitamins-Minerals (PRESERVISION AREDS 2+MULTI VIT PO) Take 1 tablet by mouth 2 (two) times daily.   Yes [provider]  rosuvastatin (CRESTOR) 10 MG tablet Take 10 mg by mouth daily.    Yes [provider]  Cholecalciferol (VITAMIN D-3) 1000 units CAPS Take 1,000 Units by mouth daily.     [provider]  dronabinol (MARINOL)  2.5 MG capsule Take 1 capsule (2.5 mg total) by mouth 2 (two) times daily before a meal. Patient not taking: Reported on 11/15/2018 11/08/18   Tyler Pita, MD  Misc Natural Products (OSTEO BI-FLEX JOINT SHIELD PO) Take by mouth.    [provider]  Omega-3 Fatty Acids (FISH OIL) 1000 MG CAPS Take 1,000 mg by mouth daily.     [provider]  prochlorperazine (COMPAZINE) 10 MG tablet Take 1 tablet (10 mg total) by mouth every 6 (six) hours as needed for nausea or vomiting. Patient not taking: Reported on 11/15/2018 11/01/18   Tyler Pita, MD    Physical Exam: Vitals:   11/15/18 1711  BP: (!) 154/76  Pulse: (!) 108  Resp: 20  Temp: 98.6 F (37 C)  TempSrc: Oral  SpO2: 94%  Weight: 101 kg  Height: 6\' 4"  (1.93 m)    Constitutional: NAD, calm, comfortable Vitals:   11/15/18 1711  BP: (!) 154/76  Pulse: (!) 108  Resp: 20  Temp: 98.6 F (37 C)  TempSrc: Oral  SpO2: 94%  Weight: 101 kg  Height: 6\' 4"  (1.93 m)   Eyes: PERRL, lids and conjunctivae normal ENMT: Mucous membranes are extremely dry. Posterior pharynx clear of any exudate or lesions.Normal dentition.  Neck: normal, supple, no masses, no thyromegaly Respiratory: clear to auscultation bilaterally, no wheezing, no crackles. Normal respiratory effort. No accessory muscle use.  Speaking in full sentences. Cardiovascular: Regular rate and rhythm, no murmurs / rubs / gallops. No extremity edema. 2+ pedal pulses. No carotid bruits.  Abdomen: no tenderness, no masses palpated. No hepatosplenomegaly. Bowel sounds positive.  Musculoskeletal: no clubbing / cyanosis. No joint deformity upper and lower extremities. Good ROM, no contractures. Normal muscle tone.  Skin: no rashes, lesions, ulcers. No induration Neurologic: CN 2-12 grossly intact. Sensation intact, DTR normal. Strength 5/5 in all 4.  Psychiatric: Normal judgment and insight. Alert and oriented x 3. Normal mood.   Labs on Admission: I have  personally reviewed following labs and imaging studies  CBC: No results for input(s): WBC, NEUTROABS, HGB, HCT, MCV, PLT in the last 168 hours. Basic Metabolic Panel: No results for input(s): NA, K, CL, CO2, GLUCOSE, BUN, CREATININE, CALCIUM, MG, PHOS in the last 168 hours. GFR: CrCl cannot be calculated (Patient's most recent lab result is older than the maximum 21 days allowed.). Liver Function Tests: No results for input(s): AST, ALT, ALKPHOS, BILITOT, PROT, ALBUMIN in the last 168 hours. No results for input(s): LIPASE, AMYLASE in the last 168 hours. No results for input(s): AMMONIA in the last 168 hours. Coagulation Profile: No results for input(s): INR, PROTIME in the last 168 hours. Cardiac Enzymes: No results for input(s): CKTOTAL, CKMB, CKMBINDEX, TROPONINI in the last 168 hours. BNP (last 3 results) No results for input(s): PROBNP in the last 8760 hours. HbA1C: No results for input(s): HGBA1C in the last 72 hours. CBG: No results for input(s): GLUCAP in the last 168 hours. Lipid Profile: No results for input(s): CHOL, HDL, LDLCALC, TRIG, CHOLHDL, LDLDIRECT in the last  72 hours. Thyroid Function Tests: No results for input(s): TSH, T4TOTAL, FREET4, T3FREE, THYROIDAB in the last 72 hours. Anemia Panel: No results for input(s): VITAMINB12, FOLATE, FERRITIN, TIBC, IRON, RETICCTPCT in the last 72 hours. Urine analysis: No results found for: COLORURINE, APPEARANCEUR, LABSPEC, [place], GLUCOSEU, HGBUR, BILIRUBINUR, KETONESUR, PROTEINUR, UROBILINOGEN, NITRITE, LEUKOCYTESUR  Radiological Exams on Admission: Ct Abdomen Pelvis Wo Contrast  Result Date: 11/14/2018 CLINICAL DATA:  History of prostate cancer. 40 pound weight loss and decreased appetite. EXAM: CT ABDOMEN AND PELVIS WITHOUT CONTRAST TECHNIQUE: Multidetector CT imaging of the abdomen and pelvis was performed following the standard protocol without IV contrast. COMPARISON:  None. FINDINGS: Lower chest: The lung bases are  clear of an acute process. I do not see any worrisome pulmonary nodules to suggest pulmonary metastatic disease. Moderate pectus deformity noted with mass effect on the right ventricle. There are age advanced three-vessel coronary artery calcifications noted. Hepatobiliary: Small scattered hepatic cysts but no worrisome hepatic lesions or intrahepatic biliary dilatation. Layering gallstones or sludge noted in the gallbladder. Normal caliber common bile duct. Pancreas: No mass, inflammation or ductal dilatation without contrast. Spleen: Splenomegaly is noted. The spleen measures 15.5 x 12.2 x 12.3 cm. No focal lesions. Adrenals/Urinary Tract: The adrenal glands and kidneys are unremarkable. No worrisome renal lesions without contrast. No renal calculi or obstructing ureteral calculi. Moderate bladder wall thickening likely due to lack of distension. No bladder mass. Stomach/Bowel: The stomach, duodenum, small bowel and colon are grossly normal without oral contrast. No obvious acute inflammatory process, mass lesions or obstructive findings. Mild to moderate rectal wall thickening likely radiation related. Vascular/Lymphatic: Advanced atherosclerotic calcifications involving the distal aorta iliac arteries but no aneurysm. Hazy interstitial changes in the small bowel mesentery with a few scattered lymph nodes is likely due to panniculitis or mesenteritis. No mesenteric or retroperitoneal mass. There are few small scattered retroperitoneal lymph nodes which are indeterminate. Comparison with any prior studies would be helpful if available. 9 mm left-sided retroperitoneal lymph node on image number 47. 8 mm right-sided retroperitoneal lymph node on image number 53. 8 mm left para-aortic lymph node on image number 52. Indeterminate 11 mm left pelvic sidewall lymph node on image number 86. Reproductive: Status post prostatectomy. Other: No free pelvic fluid collections or inguinal mass or adenopathy. Musculoskeletal:  There is a sclerotic lesion in the T12 vertebral body which is indeterminate. I do not see any other sclerotic lesions to suggest this is metastatic disease but comparison with any prior foot studies would be helpful. IMPRESSION: 1. Status post prostatectomy. Indeterminate 11 mm left pelvic sidewall lymph node and a few borderline retroperitoneal lymph nodes as detailed above. Comparison with any prior films would be helpful. 2. Splenomegaly. 3. Simple appearing hepatic cyst. 4. Three-vessel coronary artery calcifications are noted along with distal aortic and iliac artery calcifications. 5. Single slightly irregular sclerotic lesion in the T12 vertebral body is indeterminate. Comparison with any prior studies would be helpful. Electronically Signed   By: Marijo Sanes M.D.   On: 11/14/2018 16:38    EKG: None  Assessment/Plan Principal Problem:   Intractable nausea and vomiting Active Problems:   Hyperlipidemia   Prostate cancer ([place])   H/O radical prostatectomy   Failure to thrive in adult   Orthostasis   Dehydration   Fever    1 intractable nausea vomiting/failure to thrive Questionable etiology.  Patient does state has been having intractable nausea and vomiting daily over the past 3 weeks and unable to keep anything down.  Patient with decreased appetite and decreased oral intake.  Patient with a 40 pound weight loss over the past month with some associated fevers and chills and diarrhea.  Patient with recent CT scan done on 11/14/2018 with no acute abnormalities explaining patient's symptoms.  CT showing prostatectomy, pelvic sidewall lymph nodes, retroperitoneal lymph nodes, splenomegaly, simple appearing hepatic cysts, single slightly irregular sclerotic lesion in the T12 vertebral body indeterminate, three-vessel coronary artery calcifications noted along with distal aortic and iliac artery calcifications.  We will get a chest x-ray.  Check stool for C. difficile PCR.  Check a head CT, CBC  with differential, comprehensive metabolic profile, UA with cultures and sensitivities, magnesium level.  IV antiemetics, IV fluids, supportive care.  Placed on a clear liquid diet for now.  Follow.  2.  Dehydration IV fluids.  3.  Orthostasis Likely secondary to problem #1.  Repeat orthostatics today and tomorrow.  Placed on IV fluids.  Follow.  4.  Fever Questionable etiology.  Check blood cultures x2, chest x-ray, UA with cultures and sensitivities, check stool studies for C. difficile PCR.  Antipyretics.  IV fluids.  Supportive care.  Hold off on antibiotics at this time as we do not have a source.  5.  History of prostate cancer status post prostatectomy Receiving radiation treatment.  Per radiation oncology.  6.  Hyperlipidemia Hold statin.  DVT prophylaxis: Lovenox Code Status: Full Family Communication: Updated patient and wife at bedside. Disposition Plan: To be determined. Consults called: None Admission status: Admit to inpatient/MedSurg.   Irine Seal MD [facility]  If 7PM-7AM, please contact night-coverage [email or web address]  11/15/2018, 6:22 PM

## 2018-11-15 NOTE — Progress Notes (Signed)
Delivered patient and his wife to [address], room 1605. Provided Vickie, RN with report over the phone before wheeling the patient to the floor. Assisted patient into hospital bed. Vickie, RN and nurse tech in the room upon my exit. Patient in no distress. Will access patient status on Monday.

## 2018-11-15 NOTE — Telephone Encounter (Signed)
Direct admission request from Dr. Tammi Klippel from XRT clinic for failure to thrive, 30 pound weight loss over the last month, orthostatic hypotension, tachycardia.  78yom PMH prostate cancer s/p prostatectomy 2016; has been getting XRT to prostate fossa per Dr. Tammi Klippel since early January 2020. Significant weight loss last 3 weeks of about 30 pounds.   Because of his history of prostate cancer and weight loss, CT abdomen pelvis was performed February 20 which was fairly unremarkable, did show splenomegaly and some indeterminate lymph nodes.  Today in the office he is tachycardic with a resting pulse in the 120s. SBP 128/79, but orthostatic when standing.  The patient ambulated into the office today and appears hemodynamically stable for admission to a medical bed per Dr. Tammi Klippel.  Excepted to medical bed for further evaluation of orthostatic hypotension, dehydration, failure to thrive with significant weight loss.  Murray Hodgkins, MD [facility]

## 2018-11-16 ENCOUNTER — Other Ambulatory Visit: Payer: Self-pay

## 2018-11-16 DIAGNOSIS — N179 Acute kidney failure, unspecified: Secondary | ICD-10-CM | POA: Diagnosis present

## 2018-11-16 DIAGNOSIS — E538 Deficiency of other specified B group vitamins: Secondary | ICD-10-CM

## 2018-11-16 DIAGNOSIS — D649 Anemia, unspecified: Secondary | ICD-10-CM

## 2018-11-16 LAB — BASIC METABOLIC PANEL
Anion gap: 7 (ref 5–15)
BUN: 30 mg/dL — AB (ref 8–23)
CO2: 21 mmol/L — ABNORMAL LOW (ref 22–32)
Calcium: 8.3 mg/dL — ABNORMAL LOW (ref 8.9–10.3)
Chloride: 111 mmol/L (ref 98–111)
Creatinine, Ser: 2.22 mg/dL — ABNORMAL HIGH (ref 0.61–1.24)
GFR calc Af Amer: 34 mL/min — ABNORMAL LOW (ref >60)
GFR calc non Af Amer: 30 mL/min — ABNORMAL LOW (ref >60)
Glucose, Bld: 129 mg/dL — ABNORMAL HIGH (ref 70–99)
Potassium: 3.8 mmol/L (ref 3.5–5.1)
Sodium: 139 mmol/L (ref 135–145)

## 2018-11-16 LAB — VITAMIN B12: Vitamin B-12: 198 pg/mL (ref 180–914)

## 2018-11-16 LAB — GLUCOSE, CAPILLARY: GLUCOSE-CAPILLARY: 110 mg/dL — AB (ref 70–99)

## 2018-11-16 LAB — CBC
HCT: 28.9 % — ABNORMAL LOW (ref 39.0–52.0)
Hemoglobin: 8.6 g/dL — ABNORMAL LOW (ref 13.0–17.0)
MCH: 25.9 pg — ABNORMAL LOW (ref 26.0–34.0)
MCHC: 29.8 g/dL — AB (ref 30.0–36.0)
MCV: 87 fL (ref 80.0–100.0)
Platelets: 260 10*3/uL (ref 150–400)
RBC: 3.32 MIL/uL — ABNORMAL LOW (ref 4.22–5.81)
RDW: 17.1 % — ABNORMAL HIGH (ref 11.5–15.5)
WBC: 6.5 10*3/uL (ref 4.0–10.5)
nRBC: 0 % (ref 0.0–0.2)

## 2018-11-16 LAB — FOLATE: FOLATE: 6.6 ng/mL (ref >5.9)

## 2018-11-16 LAB — IRON AND TIBC
Iron: 18 ug/dL — ABNORMAL LOW (ref 45–182)
Saturation Ratios: 13 % — ABNORMAL LOW (ref 17.9–39.5)
TIBC: 144 ug/dL — ABNORMAL LOW (ref 250–450)
UIBC: 126 ug/dL

## 2018-11-16 LAB — SEDIMENTATION RATE: Sed Rate: 114 mm/hr — ABNORMAL HIGH (ref 0–16)

## 2018-11-16 LAB — C-REACTIVE PROTEIN: CRP: 10.2 mg/dL — ABNORMAL HIGH (ref <1.0)

## 2018-11-16 LAB — HIV ANTIBODY (ROUTINE TESTING W REFLEX): HIV Screen 4th Generation wRfx: NONREACTIVE

## 2018-11-16 LAB — FERRITIN: Ferritin: 853 ng/mL — ABNORMAL HIGH (ref 24–336)

## 2018-11-16 LAB — MAGNESIUM: Magnesium: 2.4 mg/dL (ref 1.7–2.4)

## 2018-11-16 MED ORDER — CYANOCOBALAMIN 1000 MCG/ML IJ SOLN
1000.0000 ug | Freq: Every day | INTRAMUSCULAR | Status: DC
  Administered 2018-11-16 – 2018-11-20 (×5): 1000 ug via INTRAMUSCULAR
  Filled 2018-11-16 (×5): qty 1

## 2018-11-16 MED ORDER — SODIUM CHLORIDE 0.9 % IV SOLN
510.0000 mg | Freq: Once | INTRAVENOUS | Status: AC
  Administered 2018-11-16: 510 mg via INTRAVENOUS
  Filled 2018-11-16: qty 17

## 2018-11-16 NOTE — Evaluation (Signed)
Physical Therapy Evaluation Patient Details Name: Roy Joyce MRN: [id number] DOB: 02/05/1951 Today's Date: 11/16/2018   History of Present Illness  Pt admitted from Radiation Oncologist's office 2* N/V, dehydration, and orthostatic hypotension.  Pt with hx of Prostate CA  Clinical Impression  Pt admitted as above and currently mobilizing at sup/IND level including ambulation in halls with min instability and no LOB.  Pt ltd by fatigue and reports no dizziness/SOB - BP sitting 130/84; standing 124/71; after ambulating 300' *-+ 133/81.  RN aware.  Discussed with pt and pt feels he is close to pre-admit baseline and PT will sign off at this time.  Please reorder if pt needs change.     Follow Up Recommendations No PT follow up    Equipment Recommendations  None recommended by PT    Recommendations for Other Services       Precautions / Restrictions Precautions Precautions: Fall Restrictions Weight Bearing Restrictions: No      Mobility  Bed Mobility Overal bed mobility: Modified Independent             General bed mobility comments: Pt unassisted to/from bed  Transfers Overall transfer level: Modified independent               General transfer comment: Pt unassisted sit<>stand  Ambulation/Gait Ambulation/Gait assistance: Min guard;Supervision Gait Distance (Feet): 300 Feet Assistive device: None Gait Pattern/deviations: Step-through pattern;Decreased step length - right;Decreased step length - left;Shuffle;Wide base of support Gait velocity: mod pace   General Gait Details: Mild intermittent instability with no LOB - including stepping bkwd and side stepping  Stairs            Wheelchair Mobility    Modified Rankin (Stroke Patients Only)       Balance Overall balance assessment: Independent                                           Pertinent Vitals/Pain Pain Assessment: No/denies pain    Home Living Family/patient  expects to be discharged to:: Private residence Living Arrangements: Spouse/significant other Available Help at Discharge: Family Type of Home: House Home Access: Level entry     Home Layout: One level        Prior Function Level of Independence: Independent               Hand Dominance        Extremity/Trunk Assessment   Upper Extremity Assessment Upper Extremity Assessment: Overall WFL for tasks assessed    Lower Extremity Assessment Lower Extremity Assessment: Overall WFL for tasks assessed       Communication   Communication: No difficulties  Cognition Arousal/Alertness: Awake/alert Behavior During Therapy: WFL for tasks assessed/performed Overall Cognitive Status: Within Functional Limits for tasks assessed                                        General Comments      Exercises     Assessment/Plan    PT Assessment Patent does not need any further PT services  PT Problem List         PT Treatment Interventions      PT Goals (Current goals can be found in the Care Plan section)  Acute Rehab PT Goals Patient Stated Goal: Resume previous lifestyle PT  Goal Formulation: All assessment and education complete, DC therapy    Frequency Min 1X/week   Barriers to discharge        Co-evaluation               AM-PAC PT "6 Clicks" Mobility  Outcome Measure Help needed turning from your back to your side while in a flat bed without using bedrails?: None Help needed moving from lying on your back to sitting on the side of a flat bed without using bedrails?: None Help needed moving to and from a bed to a chair (including a wheelchair)?: None Help needed standing up from a chair using your arms (e.g., wheelchair or bedside chair)?: None Help needed to walk in hospital room?: None Help needed climbing 3-5 steps with a railing? : None 6 Click Score: 24    End of Session Equipment Utilized During Treatment: Gait belt Activity  Tolerance: Patient tolerated treatment well;Patient limited by fatigue Patient left: in bed;with call bell/phone within reach Nurse Communication: Mobility status PT Visit Diagnosis: Difficulty in walking, not elsewhere classified (R26.2)    Time: 1435-1450 PT Time Calculation (min) (ACUTE ONLY): 15 min   Charges:   PT Evaluation $PT Eval Low Complexity: 1 Low          Roy Joyce Pager [phone] Office [phone]   Roy Joyce 11/16/2018, 3:20 PM

## 2018-11-16 NOTE — Plan of Care (Signed)
Placed on falls risk, oriented to fall risk policy, urinal at bedside, pt. Verbalized understanding he would call for help and will not get up without assistance. Will continue to monitor pt. Closely. Kjones RN

## 2018-11-16 NOTE — Progress Notes (Signed)
PROGRESS NOTE    Roy Joyce  CBJ:[id number] DOB: 09/01/1951 DOA: 11/15/2018 PCP: Yevette Edwards, MD   Brief Narrative:  Patient is a 68 year old gentleman history of hyperlipidemia, prostate cancer status post prostatectomy 2016 undergoing radiation treatment to prostate fossa per Dr. Tammi Klippel, presented to radiation oncology clinic noted to have failure to thrive, 40 pound weight loss over the past month, orthostatic hypotension and tachycardia with intractable nausea and vomiting for the past 3 weeks with decreased oral intake.  Patient admitted and noted to be in acute renal failure, anemic.  Patient placed on IV fluids.   Assessment & Plan:   Principal Problem:   Intractable nausea and vomiting Active Problems:   Hyperlipidemia   Prostate cancer ([place])   H/O radical prostatectomy   Failure to thrive in adult   Orthostasis   Dehydration   Fever   ARF (acute renal failure) (HCC)   Low vitamin B12 level   Anemia  1 intractable nausea vomiting/failure to thrive Questionable etiology.  Patient does state has been having intractable nausea and vomiting daily over the past 3 weeks and unable to keep anything down.  Patient with decreased appetite and decreased oral intake.  Patient with a 40 pound weight loss over the past month with some associated fevers and chills and diarrhea.  Patient with recent CT scan done on 11/14/2018 with no acute abnormalities explaining patient's symptoms.  CT showing prostatectomy, pelvic sidewall lymph nodes, retroperitoneal lymph nodes, splenomegaly, simple appearing hepatic cysts, single slightly irregular sclerotic lesion in the T12 vertebral body indeterminate, three-vessel coronary artery calcifications noted along with distal aortic and iliac artery calcifications.  Chest x-ray done negative for any acute abnormalities.  C. difficile PCR pending as patient did state he been having bouts of diarrhea over the past several days prior to admission.   Patient with no diarrhea since admission.  CT head negative.  Blood cultures pending.  Urinalysis unremarkable.  Patient tolerated clear liquids however stated had a bout of emesis early this morning around 2 AM.  Advance to full liquid diet.  Speech evaluation pending.  Continue IV fluids, antiemetics, supportive care.  2.  Acute renal failure On admission patient noted to have a creatinine of 2.43.  Last creatinine in the system was 1.13 on 02/18/2016.  Likely secondary to prerenal azotemia due to decreased oral intake, intractable nausea and vomiting.  Urinalysis nitrite negative with 30 of protein.  Fractional excretion of sodium was 0.61%.  Renal ultrasound negative for hydronephrosis.  Creatinine currently at 2.22.  Patient with a urine output of 1 L over the past 24 hours.  Continue IV fluids.  Follow.  3.  Transaminitis Questionable etiology.  Patient noted to have elevated LFTs.  Patient with nausea vomiting poor oral intake and on a statin.  Patient with recent CT abdomen and pelvis done on 11/14/2018 which showed simple appearing hepatic cyst.  Continue IV fluids and follow.  If no significant improvement will check an acute hepatitis panel.  Patient however did state has a history of hepatitis since childhood.  Hold statin.  4.  Anemia/low vitamin B12 levels Patient with a hemoglobin of 9.0 on admission.  Last hemoglobin was 14.7 on 02/18/2016 per epic.  Hemoglobin this morning is 8.6.  Likely dilutional component.  Patient denies any overt bleeding.  Anemia panel with a iron of 18, TIBC of 144, ferritin of 853, folate of 6.6.  Vitamin B12 of 198.  We will give IV Feraheme x1.  Vitamin B12 1000  MCG's IM daily x1 week, and then weekly x1 month, and then monthly.  Follow H&H.  Transfuse as needed.  5.  Hypokalemia Replete.  6.  Dehydration Continue IV fluids.  7.  Orthostasis Likely secondary to problem #1.  Repeat orthostatics pending.  Continue IV fluids.  Follow.   8.   Fever Questionable etiology.  Patient has been pancultured cultures pending.  Patient noted to have loose stools per patient on admission and C. difficile PCR pending.  No bowel movement per RN.   Urinalysis nitrite negative leukocytes negative.  Continue antipyretics, IV fluids, supportive care.  No need for antibiotics at this time.  Follow.   9.  History of prostate cancer status post prostatectomy Receiving radiation treatment.  Per radiation oncology.  10.  Hyperlipidemia Patient noted to have elevated LFTs.  Continue to hold statin.    DVT prophylaxis: Lovenox/SCDs Code Status: Full Family Communication: Updated patient.  No family at bedside. Disposition Plan: Remain inpatient for now as patient presented with intractable nausea vomiting requiring ongoing IV fluids, and acute renal failure.   Consultants:   None  Procedures:   Renal ultrasound 11/15/2018  Chest x-ray 11/15/2018  CT head 11/15/2018  Antimicrobials:   None   Subjective: Patient sitting up in bed.  Patient states tolerated clear liquids this morning.  No chest pain.  No shortness of breath.  No abdominal pain.  No loose stools.  Patient had a bout of nausea and states an episode of emesis around 2 AM this morning.  No emesis since then.  Patient denies any overt bleeding.  Objective: Vitals:   11/15/18 1711 11/15/18 2300 11/16/18 0500 11/16/18 0852  BP: (!) 154/76 (!) 151/83 (!) 145/79   Pulse: (!) 108 96 (!) 101   Resp: 20 20 20    Temp: 98.6 F (37 C) 98.6 F (37 C) 99.6 F (37.6 C)   TempSrc: Oral Oral Oral   SpO2: 94% 95% 94%   Weight: 101 kg   102.9 kg  Height: 6\' 4"  (1.93 m)       Intake/Output Summary (Last 24 hours) at 11/16/2018 1137 Last data filed at 11/16/2018 1006 Gross per 24 hour  Intake 2044.09 ml  Output 1550 ml  Net 494.09 ml   Filed Weights   11/15/18 1711 11/16/18 0852  Weight: 101 kg 102.9 kg    Examination:  General exam: Appears calm and  comfortable. Respiratory system: Clear to auscultation. Respiratory effort normal. Cardiovascular system: S1 & S2 heard, RRR. No JVD, murmurs, rubs, gallops or clicks. No pedal edema. Gastrointestinal system: Abdomen is nondistended, soft and nontender. No organomegaly or masses felt. Normal bowel sounds heard. Central nervous system: Alert and oriented. No focal neurological deficits. Extremities: Symmetric 5 x 5 power. Skin: No rashes, lesions or ulcers Psychiatry: Judgement and insight appear normal. Mood & affect appropriate.     Data Reviewed: I have personally reviewed following labs and imaging studies  CBC: Recent Labs  Lab 11/15/18 1841 11/16/18 0451  WBC 5.8 6.5  NEUTROABS 4.9  --   HGB 9.0* 8.6*  HCT 30.1* 28.9*  MCV 86.7 87.0  PLT 276 761   Basic Metabolic Panel: Recent Labs  Lab 11/15/18 1841 11/16/18 0451  NA 138 139  K 3.4* 3.8  CL 107 111  CO2 21* 21*  GLUCOSE 120* 129*  BUN 31* 30*  CREATININE 2.43* 2.22*  CALCIUM 8.4* 8.3*  MG 1.8 2.4  PHOS 3.9  --    GFR: Estimated Creatinine Clearance: 39.6  mL/min (A) (by C-G formula based on SCr of 2.22 mg/dL (H)). Liver Function Tests: Recent Labs  Lab 11/15/18 1841  AST 75*  ALT 128*  ALKPHOS 387*  BILITOT 0.8  PROT 6.3*  ALBUMIN 2.3*   No results for input(s): LIPASE, AMYLASE in the last 168 hours. No results for input(s): AMMONIA in the last 168 hours. Coagulation Profile: No results for input(s): INR, PROTIME in the last 168 hours. Cardiac Enzymes: No results for input(s): CKTOTAL, CKMB, CKMBINDEX, TROPONINI in the last 168 hours. BNP (last 3 results) No results for input(s): PROBNP in the last 8760 hours. HbA1C: No results for input(s): HGBA1C in the last 72 hours. CBG: Recent Labs  Lab 11/16/18 0731  GLUCAP 110*   Lipid Profile: No results for input(s): CHOL, HDL, LDLCALC, TRIG, CHOLHDL, LDLDIRECT in the last 72 hours. Thyroid Function Tests: Recent Labs    11/15/18 1841  TSH  1.224   Anemia Panel: Recent Labs    11/16/18 0451  VITAMINB12 198  FOLATE 6.6  FERRITIN 853*  TIBC 144*  IRON 18*   Sepsis Labs: No results for input(s): PROCALCITON, LATICACIDVEN in the last 168 hours.  No results found for this or any previous visit (from the past 240 hour(s)).       Radiology Studies: Ct Abdomen Pelvis Wo Contrast  Result Date: 11/14/2018 CLINICAL DATA:  History of prostate cancer. 40 pound weight loss and decreased appetite. EXAM: CT ABDOMEN AND PELVIS WITHOUT CONTRAST TECHNIQUE: Multidetector CT imaging of the abdomen and pelvis was performed following the standard protocol without IV contrast. COMPARISON:  None. FINDINGS: Lower chest: The lung bases are clear of an acute process. I do not see any worrisome pulmonary nodules to suggest pulmonary metastatic disease. Moderate pectus deformity noted with mass effect on the right ventricle. There are age advanced three-vessel coronary artery calcifications noted. Hepatobiliary: Small scattered hepatic cysts but no worrisome hepatic lesions or intrahepatic biliary dilatation. Layering gallstones or sludge noted in the gallbladder. Normal caliber common bile duct. Pancreas: No mass, inflammation or ductal dilatation without contrast. Spleen: Splenomegaly is noted. The spleen measures 15.5 x 12.2 x 12.3 cm. No focal lesions. Adrenals/Urinary Tract: The adrenal glands and kidneys are unremarkable. No worrisome renal lesions without contrast. No renal calculi or obstructing ureteral calculi. Moderate bladder wall thickening likely due to lack of distension. No bladder mass. Stomach/Bowel: The stomach, duodenum, small bowel and colon are grossly normal without oral contrast. No obvious acute inflammatory process, mass lesions or obstructive findings. Mild to moderate rectal wall thickening likely radiation related. Vascular/Lymphatic: Advanced atherosclerotic calcifications involving the distal aorta iliac arteries but no  aneurysm. Hazy interstitial changes in the small bowel mesentery with a few scattered lymph nodes is likely due to panniculitis or mesenteritis. No mesenteric or retroperitoneal mass. There are few small scattered retroperitoneal lymph nodes which are indeterminate. Comparison with any prior studies would be helpful if available. 9 mm left-sided retroperitoneal lymph node on image number 47. 8 mm right-sided retroperitoneal lymph node on image number 53. 8 mm left para-aortic lymph node on image number 52. Indeterminate 11 mm left pelvic sidewall lymph node on image number 86. Reproductive: Status post prostatectomy. Other: No free pelvic fluid collections or inguinal mass or adenopathy. Musculoskeletal: There is a sclerotic lesion in the T12 vertebral body which is indeterminate. I do not see any other sclerotic lesions to suggest this is metastatic disease but comparison with any prior foot studies would be helpful. IMPRESSION: 1. Status post prostatectomy. Indeterminate  11 mm left pelvic sidewall lymph node and a few borderline retroperitoneal lymph nodes as detailed above. Comparison with any prior films would be helpful. 2. Splenomegaly. 3. Simple appearing hepatic cyst. 4. Three-vessel coronary artery calcifications are noted along with distal aortic and iliac artery calcifications. 5. Single slightly irregular sclerotic lesion in the T12 vertebral body is indeterminate. Comparison with any prior studies would be helpful. Electronically Signed   By: Marijo Sanes M.D.   On: 11/14/2018 16:38   X-ray Chest Pa And Lateral  Result Date: 11/15/2018 CLINICAL DATA:  Fever EXAM: CHEST - 2 VIEW COMPARISON:  None. FINDINGS: No focal airspace disease or effusion. Normal heart size. Aortic atherosclerosis. No pneumothorax. IMPRESSION: No active cardiopulmonary disease. Electronically Signed   By: Donavan Foil M.D.   On: 11/15/2018 21:50   Ct Head Wo Contrast  Result Date: 11/15/2018 CLINICAL DATA:  Nausea and  vomiting.  History of prostate cancer. EXAM: CT HEAD WITHOUT CONTRAST TECHNIQUE: Contiguous axial images were obtained from the base of the skull through the vertex without intravenous contrast. COMPARISON:  None. FINDINGS: BRAIN: No intraparenchymal hemorrhage, mass effect nor midline shift. No parenchymal brain volume loss for age. No hydrocephalus. No acute large vascular territory infarcts. No abnormal extra-axial fluid collections. Basal cisterns are patent. VASCULAR: Mild calcific atherosclerosis of the carotid siphons. SKULL: No skull fracture. Nonspecific subcentimeter sclerotic focus LEFT clivus. No significant scalp soft tissue swelling. SINUSES/ORBITS: Trace paranasal sinus mucosal thickening. Mastoid air cells are well aerated.The included ocular globes and orbital contents are non-suspicious. OTHER: None. IMPRESSION: 1. Negative noncontrast CT HEAD for age. 2. Clival bone island, less likely prostatic metastasis. Electronically Signed   By: Elon Alas M.D.   On: 11/15/2018 21:19   US Renal  Result Date: 11/15/2018 CLINICAL DATA:  Acute renal failure EXAM: RENAL / URINARY TRACT ULTRASOUND COMPLETE COMPARISON:  CT 11/14/2018 FINDINGS: Right Kidney: Renal measurements: 11.4 x 6.6 x 6.7 cm = volume: 265 mL . Echogenicity within normal limits. No mass or hydronephrosis visualized. Left Kidney: Renal measurements: 12 x 5.7 x 6 cm = volume: 217 mL. Echogenicity within normal limits. No mass or hydronephrosis visualized. Bladder: Appears normal for degree of bladder distention. IMPRESSION: Negative renal ultrasound Electronically Signed   By: Donavan Foil M.D.   On: 11/15/2018 21:54        Scheduled Meds: . aspirin  81 mg Oral Daily  . cyanocobalamin  1,000 mcg Intramuscular Daily  . enoxaparin (LOVENOX) injection  40 mg Subcutaneous Q24H  . multivitamin  1 tablet Oral BID  . sodium chloride flush  3 mL Intravenous Q12H   Continuous Infusions: . sodium chloride 125 mL/hr at 11/16/18  0404     LOS: 1 day    Time spent: 76 mins    Irine Seal, MD [facility]  If 7PM-7AM, please contact night-coverage [email or web address] Password [facility] 11/16/2018, 11:37 AM

## 2018-11-16 NOTE — Progress Notes (Signed)
  Radiation Oncology         [phone] ________________________________  Name: Roy Joyce MRN: [id number]  Date: 11/15/2018  DOB: 1950-12-17  Chart Note:  I reviewed this patient's most recent findings and wanted to take a minute to document my impression.  I greatly appreciate the support of the entire inpatient team and hospitalists in caring for this patient.  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  Given his current symptoms, I suspect a ESR sed rate and CRP C-reactive protein may help determine if his current issues are related to his autoimmune disease.   ________________________________  Sheral Apley Tammi Klippel, M.D.

## 2018-11-16 NOTE — Evaluation (Addendum)
Clinical/Bedside Swallow Evaluation Patient Details  Name: Roy Joyce MRN: [id number] Date of Birth: 30-Nov-1950  Today's Date: 11/16/2018 Time: SLP Start Time (ACUTE ONLY): 1120 SLP Stop Time (ACUTE ONLY): 1155 SLP Time Calculation (min) (ACUTE ONLY): 35 min  Past Medical History:  Past Medical History:  Diagnosis Date  . Heart murmur   . Hepatitis 1962  . Hyperlipidemia   . Prostate cancer [facility])    Past Surgical History:  Past Surgical History:  Procedure Laterality Date  . LYMPHADENECTOMY Bilateral 06/11/2015   Procedure: BILATERAL PELVIC NODE DISSECTION ;  Surgeon: Ardis Hughs, MD;  Location: [facility]S;  Service: Urology;  Laterality: Bilateral;  . MOUTH SURGERY    . ROBOT ASSISTED LAPAROSCOPIC RADICAL PROSTATECTOMY Bilateral 06/11/2015   Procedure: ROBOTIC ASSISTED LAPAROSCOPIC PROSTATECTOMY WITH ;  Surgeon: Ardis Hughs, MD;  Location: [facility]S;  Service: Urology;  Laterality: Bilateral;  . SKIN BIOPSY    . TONSILLECTOMY     HPI:  Patient is a 68 y.o. male with PMH: hyperlipidemia, hepatitis noted during childhood, prostate cancer s/p prostatectomy 2016 undergoing radiation treatment, who presented to radiation clinic and noted with FTT, 40 pound weight loss over a period of one month, and with orthostatic hypotension and tachycardia. Patient reported three week h/o nausea and vomiting with decreased oral intake and wife stating he has been coughng when drinking clear liquids. Patient has reported temperature of 101-102.5 in the evenings for past 10 days prior to admission, with diarhea and feeling winded on minimal exertion.   Assessment / Plan / Recommendation Clinical Impression  Patient presents with an oropharyngeal swallow that is [facility] as per this assessment, however was limited due to patient only wanting to drink liquids and eat a small amount of puree solids. (He had recently been advanced from clear liquids to full liquids and does not feel comfortable trying  solids yet). Patient did not exhibit any overt s/s of aspiration or penetration with tested boluses of puree solids and thin liquids, and swallow intiation, laryngeal elevation and pharyngeal contraction were all WFL. Patient in agreement with plan to stay on full liquids today and transition to regular solids tomorrow AM (2/23). SLP Visit Diagnosis: Dysphagia, unspecified (R13.10)    Aspiration Risk  Mild aspiration risk    Diet Recommendation Thin liquid;Regular   Liquid Administration via: Cup;Straw Medication Administration: Whole meds with liquid Supervision: Patient able to self feed;Intermittent supervision to cue for compensatory strategies Compensations: Minimize environmental distractions;Slow rate;Small sips/bites Postural Changes: Seated upright at 90 degrees;Remain upright for at least 30 minutes after po intake    Other  Recommendations Oral Care Recommendations: Oral care BID   Follow up Recommendations None      Frequency and Duration min 1 x/week  1 week       Prognosis Prognosis for Safe Diet Advancement: Good      Swallow Study   General Date of Onset: 11/15/18 IOX:BDZHGD is a 68 y.o. male with PMH: hyperlipidemia, hepatitis noted during childhood, prostate cancer s/p prostatectomy 2016 undergoing radiation treatment, who presented to radiation clinic and noted with FTT, 40 pound weight loss over a period of one month, and with orthostatic hypotension and tachycardia. Patient reported three week h/o nausea and vomiting with decreased oral intake and wife stating he has been coughng when drinking clear liquids. Patient has reported temperature of 101-102.5 in the evenings for past 10 days prior to admission, with diarhea and feeling winded on minimal exertion.  Type of Study: Bedside Swallow Evaluation Previous  Swallow Assessment: N/A Diet Prior to this Study: Thin liquids(full liquids) Temperature Spikes Noted: No Respiratory Status: Room air History of Recent  Intubation: No Behavior/Cognition: Alert;Cooperative;Pleasant mood Oral Cavity Assessment: Within Functional Limits Oral Care Completed by SLP: Yes Oral Cavity - Dentition: Dentures, not available;Edentulous Self-Feeding Abilities: Able to feed self Patient Positioning: Upright in bed Baseline Vocal Quality: Normal Volitional Cough: Strong Volitional Swallow: Able to elicit    Oral/Motor/Sensory Function Overall Oral Motor/Sensory Function: Mild impairment Facial ROM: Within Functional Limits Facial Symmetry: Within Functional Limits Facial Strength: Within Functional Limits Facial Sensation: Within Functional Limits Lingual ROM: Within Functional Limits Lingual Strength: Reduced Velum: Within Functional Limits Mandible: Within Functional Limits   Ice Chips Ice chips: Not tested   Thin Liquid Thin Liquid: Within functional limits Presentation: Cup;Straw Other Comments: No overt s/s aspiration or penetration, swallow initiation was timely and pharyngeal contraction and laryngeal elevation WFL    Nectar Thick Nectar Thick Liquid: Not tested   Honey Thick Honey Thick Liquid: Not tested   Puree Puree: Within functional limits   Solid     Solid: Not tested Other Comments: patient declined.     Sonia Baller, MA, CCC-SLP 11/16/18 2:22 PM

## 2018-11-17 LAB — COMPREHENSIVE METABOLIC PANEL
ALT: 102 U/L — ABNORMAL HIGH (ref 0–44)
AST: 56 U/L — ABNORMAL HIGH (ref 15–41)
Albumin: 2.2 g/dL — ABNORMAL LOW (ref 3.5–5.0)
Alkaline Phosphatase: 336 U/L — ABNORMAL HIGH (ref 38–126)
Anion gap: 6 (ref 5–15)
BUN: 26 mg/dL — ABNORMAL HIGH (ref 8–23)
CHLORIDE: 109 mmol/L (ref 98–111)
CO2: 21 mmol/L — ABNORMAL LOW (ref 22–32)
Calcium: 8 mg/dL — ABNORMAL LOW (ref 8.9–10.3)
Creatinine, Ser: 2 mg/dL — ABNORMAL HIGH (ref 0.61–1.24)
GFR calc Af Amer: 39 mL/min — ABNORMAL LOW (ref >60)
GFR calc non Af Amer: 34 mL/min — ABNORMAL LOW (ref >60)
Glucose, Bld: 109 mg/dL — ABNORMAL HIGH (ref 70–99)
Potassium: 3.6 mmol/L (ref 3.5–5.1)
SODIUM: 136 mmol/L (ref 135–145)
Total Bilirubin: 1 mg/dL (ref 0.3–1.2)
Total Protein: 5.7 g/dL — ABNORMAL LOW (ref 6.5–8.1)

## 2018-11-17 LAB — CBC WITH DIFFERENTIAL/PLATELET
ABS IMMATURE GRANULOCYTES: 0.03 10*3/uL (ref 0.00–0.07)
Basophils Absolute: 0 10*3/uL (ref 0.0–0.1)
Basophils Relative: 0 %
Eosinophils Absolute: 0.1 10*3/uL (ref 0.0–0.5)
Eosinophils Relative: 1 %
HCT: 26.7 % — ABNORMAL LOW (ref 39.0–52.0)
Hemoglobin: 8 g/dL — ABNORMAL LOW (ref 13.0–17.0)
Immature Granulocytes: 1 %
Lymphocytes Relative: 9 %
Lymphs Abs: 0.5 10*3/uL — ABNORMAL LOW (ref 0.7–4.0)
MCH: 26.1 pg (ref 26.0–34.0)
MCHC: 30 g/dL (ref 30.0–36.0)
MCV: 87 fL (ref 80.0–100.0)
MONO ABS: 0.3 10*3/uL (ref 0.1–1.0)
Monocytes Relative: 6 %
Neutro Abs: 4.3 10*3/uL (ref 1.7–7.7)
Neutrophils Relative %: 83 %
Platelets: 244 10*3/uL (ref 150–400)
RBC: 3.07 MIL/uL — ABNORMAL LOW (ref 4.22–5.81)
RDW: 17.2 % — ABNORMAL HIGH (ref 11.5–15.5)
WBC: 5.1 10*3/uL (ref 4.0–10.5)
nRBC: 0 % (ref 0.0–0.2)

## 2018-11-17 LAB — GLUCOSE, CAPILLARY: Glucose-Capillary: 100 mg/dL — ABNORMAL HIGH (ref 70–99)

## 2018-11-17 LAB — HEMOGLOBIN AND HEMATOCRIT, BLOOD
HCT: 27.9 % — ABNORMAL LOW (ref 39.0–52.0)
Hemoglobin: 8.1 g/dL — ABNORMAL LOW (ref 13.0–17.0)

## 2018-11-17 LAB — URINE CULTURE: Culture: NO GROWTH

## 2018-11-17 LAB — MAGNESIUM: Magnesium: 1.9 mg/dL (ref 1.7–2.4)

## 2018-11-17 NOTE — Progress Notes (Signed)
OT Cancellation Note  Patient Details Name: Roy Joyce MRN: [id number] DOB: [birth year]   Cancelled Treatment:    Reason Eval/Treat Not Completed: OT screened, no needs identified, will sign off  Physical Therapy stated pt back to baseline.  Kari Baars, OT Acute Rehabilitation Services [phone] Office- [phone], Thereasa Parkin 11/17/2018, 7:22 AM

## 2018-11-17 NOTE — Consult Note (Signed)
Referring Provider: Dr. Grandville Silos Primary Care Physician:  Yevette Edwards, MD Primary Gastroenterologist:  Althia Forts  Reason for Consultation:  Nausea/Vomiting; Weight loss  HPI: Roy Joyce is a 68 y.o. male with history of prostate cancer on radiation therapy who was admitted for failure to thrive and 40 pound weight loss with recurrent nausea and vomiting. Reportedly has been having recurrent N/V for the past 3 weeks that he says only happens if he eats too fast. Has had a poor appetite since being on the radiation treatment and has unintentionally lost 40 pounds in the past month. Denies abdominal pain, dysphagia, heartburn, melena, or hematochezia. History of mesenteric panniculitis. Reports a normal colonoscopy 10 years ago at [facility].  Past Medical History:  Diagnosis Date  . Heart murmur   . Hepatitis 1962  . Hyperlipidemia   . Prostate cancer [facility])     Past Surgical History:  Procedure Laterality Date  . LYMPHADENECTOMY Bilateral 06/11/2015   Procedure: BILATERAL PELVIC NODE DISSECTION ;  Surgeon: Ardis Hughs, MD;  Location: [facility];  Service: Urology;  Laterality: Bilateral;  . MOUTH SURGERY    . ROBOT ASSISTED LAPAROSCOPIC RADICAL PROSTATECTOMY Bilateral 06/11/2015   Procedure: ROBOTIC ASSISTED LAPAROSCOPIC PROSTATECTOMY WITH ;  Surgeon: Ardis Hughs, MD;  Location: [facility];  Service: Urology;  Laterality: Bilateral;  . SKIN BIOPSY    . TONSILLECTOMY      Prior to Admission medications   Medication Sig Start Date End Date Taking? Authorizing Provider  acetaminophen (TYLENOL) 500 MG tablet Take 500-1,000 mg by mouth every 6 (six) hours as needed for mild pain or fever.   Yes [provider]  ASPIRIN 81 PO Take 81 mg by mouth daily.    Yes [provider]  Multiple Vitamins-Minerals (PRESERVISION AREDS 2+MULTI VIT PO) Take 1 tablet by mouth 2 (two) times daily.   Yes [provider]  rosuvastatin (CRESTOR) 10 MG  tablet Take 10 mg by mouth daily.    Yes [provider]  Cholecalciferol (VITAMIN D-3) 1000 units CAPS Take 1,000 Units by mouth daily.     [provider]  dronabinol (MARINOL) 2.5 MG capsule Take 1 capsule (2.5 mg total) by mouth 2 (two) times daily before a meal. Patient not taking: Reported on 11/15/2018 11/08/18   Tyler Pita, MD  Misc Natural Products (OSTEO BI-FLEX JOINT SHIELD PO) Take by mouth.    [provider]  Omega-3 Fatty Acids (FISH OIL) 1000 MG CAPS Take 1,000 mg by mouth daily.     [provider]  prochlorperazine (COMPAZINE) 10 MG tablet Take 1 tablet (10 mg total) by mouth every 6 (six) hours as needed for nausea or vomiting. Patient not taking: Reported on 11/15/2018 11/01/18   Tyler Pita, MD    Scheduled Meds: . aspirin  81 mg Oral Daily  . cyanocobalamin  1,000 mcg Intramuscular Daily  . enoxaparin (LOVENOX) injection  40 mg Subcutaneous Q24H  . multivitamin  1 tablet Oral BID  . sodium chloride flush  3 mL Intravenous Q12H   Continuous Infusions: . sodium chloride 125 mL/hr at 11/17/18 1310   PRN Meds:.acetaminophen **OR** acetaminophen, ketorolac, levalbuterol, magnesium citrate, ondansetron **OR** ondansetron (ZOFRAN) IV, polyethylene glycol, prochlorperazine, sorbitol, traMADol  Allergies as of 11/15/2018  . (No Known Allergies)    Family History  Problem Relation Age of Onset  . Lymphoma Father        dairy farmer/crops/pesticides  . Kidney cancer Neg Hx   . Prostate cancer Neg  Hx   . Bladder Cancer Neg Hx   . Breast cancer Neg Hx   . Pancreatic cancer Neg Hx   . Colon cancer Neg Hx     Social History   Socioeconomic History  . Marital status: Married    Spouse name: Not on file  . Number of children: Not on file  . Years of education: Not on file  . Highest education level: Not on file  Occupational History  . Not on file  Social Needs  . Financial resource strain: Not on file  . Food  insecurity:    Worry: Not on file    Inability: Not on file  . Transportation needs:    Medical: Not on file    Non-medical: Not on file  Tobacco Use  . Smoking status: Former Smoker    Packs/day: 0.50    Years: 30.00    Pack years: 15.00    Types: Cigarettes    Last attempt to quit: 03/08/2005    Years since quitting: 13.7  . Smokeless tobacco: Never Used  . Tobacco comment: quit 10 years/camel non filters  Substance and Sexual Activity  . Alcohol use: Yes    Alcohol/week: 2.0 standard drinks    Types: 2 Standard drinks or equivalent per week    Comment: ocassional  . Drug use: No  . Sexual activity: Yes    Comment: with aid of Trimix and vaccum device  Lifestyle  . Physical activity:    Days per week: Not on file    Minutes per session: Not on file  . Stress: Not on file  Relationships  . Social connections:    Talks on phone: Not on file    Gets together: Not on file    Attends religious service: Not on file    Active member of club or organization: Not on file    Attends meetings of clubs or organizations: Not on file    Relationship status: Not on file  . Intimate partner violence:    Fear of current or ex partner: Not on file    Emotionally abused: Not on file    Physically abused: Not on file    Forced sexual activity: Not on file  Other Topics Concern  . Not on file  Social History Narrative  . Not on file    Review of Systems: All negative except as stated above in HPI.  Physical Exam: Vital signs: Vitals:   11/17/18 0602 11/17/18 1308  BP: 139/74 (!) 143/73  Pulse: 92 85  Resp: 15 16  Temp: 98.9 F (37.2 C) 97.8 F (36.6 C)  SpO2: 96% 96%   Last BM Date: 11/15/18 General:   Lethargic, elderly, Well-developed, well-nourished, pleasant and cooperative in NAD Head: normocephalic, atraumatic Eyes: anicteric sclera ENT: oropharynx clear Neck: supple, nontender Lungs:  Clear throughout to auscultation.   No wheezes, crackles, or rhonchi. No acute  distress. Heart:  Regular rate and rhythm; no murmurs, clicks, rubs,  or gallops. Abdomen: soft, nontender, nondistended, +BS  Rectal:  Deferred Ext: +LE pitting edema  GI:  Lab Results: Recent Labs    11/15/18 1841 11/16/18 0451 11/17/18 0513  WBC 5.8 6.5 5.1  HGB 9.0* 8.6* 8.0*  HCT 30.1* 28.9* 26.7*  PLT 276 260 244   BMET Recent Labs    11/15/18 1841 11/16/18 0451 11/17/18 0513  NA 138 139 136  K 3.4* 3.8 3.6  CL 107 111 109  CO2 21* 21* 21*  GLUCOSE 120* 129*  109*  BUN 31* 30* 26*  CREATININE 2.43* 2.22* 2.00*  CALCIUM 8.4* 8.3* 8.0*   LFT Recent Labs    11/17/18 0513  PROT 5.7*  ALBUMIN 2.2*  AST 56*  ALT 102*  ALKPHOS 336*  BILITOT 1.0   PT/INR No results for input(s): LABPROT, INR in the last 72 hours.   Studies/Results: X-ray Chest Pa And Lateral  Result Date: 11/15/2018 CLINICAL DATA:  Fever EXAM: CHEST - 2 VIEW COMPARISON:  None. FINDINGS: No focal airspace disease or effusion. Normal heart size. Aortic atherosclerosis. No pneumothorax. IMPRESSION: No active cardiopulmonary disease. Electronically Signed   By: Donavan Foil M.D.   On: 11/15/2018 21:50   Ct Head Wo Contrast  Result Date: 11/15/2018 CLINICAL DATA:  Nausea and vomiting.  History of prostate cancer. EXAM: CT HEAD WITHOUT CONTRAST TECHNIQUE: Contiguous axial images were obtained from the base of the skull through the vertex without intravenous contrast. COMPARISON:  None. FINDINGS: BRAIN: No intraparenchymal hemorrhage, mass effect nor midline shift. No parenchymal brain volume loss for age. No hydrocephalus. No acute large vascular territory infarcts. No abnormal extra-axial fluid collections. Basal cisterns are patent. VASCULAR: Mild calcific atherosclerosis of the carotid siphons. SKULL: No skull fracture. Nonspecific subcentimeter sclerotic focus LEFT clivus. No significant scalp soft tissue swelling. SINUSES/ORBITS: Trace paranasal sinus mucosal thickening. Mastoid air cells are well  aerated.The included ocular globes and orbital contents are non-suspicious. OTHER: None. IMPRESSION: 1. Negative noncontrast CT HEAD for age. 2. Clival bone island, less likely prostatic metastasis. Electronically Signed   By: Elon Alas M.D.   On: 11/15/2018 21:19   US Renal  Result Date: 11/15/2018 CLINICAL DATA:  Acute renal failure EXAM: RENAL / URINARY TRACT ULTRASOUND COMPLETE COMPARISON:  CT 11/14/2018 FINDINGS: Right Kidney: Renal measurements: 11.4 x 6.6 x 6.7 cm = volume: 265 mL . Echogenicity within normal limits. No mass or hydronephrosis visualized. Left Kidney: Renal measurements: 12 x 5.7 x 6 cm = volume: 217 mL. Echogenicity within normal limits. No mass or hydronephrosis visualized. Bladder: Appears normal for degree of bladder distention. IMPRESSION: Negative renal ultrasound Electronically Signed   By: Donavan Foil M.D.   On: 11/15/2018 21:54    Impression/Plan: Recurrent nausea and vomiting and weight loss on radiation treatment for prostate cancer. No history of GI bleeding or abdominal pain. I do not think his reported mesenteric panniculitis is causing his symptoms but suspect the radiation treatment is the main source. He does need an EGD to evaluate for peptic ulcer disease. NPO p MN for EGD tomorrow by Dr. Alessandra Bevels.    LOS: 2 days   Lear Ng  11/17/2018, 2:20 PM  Questions please call [phone]

## 2018-11-17 NOTE — Progress Notes (Signed)
PROGRESS NOTE    Roy Joyce  QIO:[id number] DOB: 08-12-1951 DOA: 11/15/2018 PCP: Yevette Edwards, MD   Brief Narrative:  Patient is a 68 year old gentleman history of hyperlipidemia, prostate cancer status post prostatectomy 2016 undergoing radiation treatment to prostate fossa per Dr. Tammi Klippel, presented to radiation oncology clinic noted to have failure to thrive, 40 pound weight loss over the past month, orthostatic hypotension and tachycardia with intractable nausea and vomiting for the past 3 weeks with decreased oral intake.  Patient admitted and noted to be in acute renal failure, anemic.  Patient placed on IV fluids.   Assessment & Plan:   Principal Problem:   Intractable nausea and vomiting Active Problems:   Hyperlipidemia   Prostate cancer ([place])   H/O radical prostatectomy   Failure to thrive in adult   Orthostasis   Dehydration   Fever   ARF (acute renal failure) (HCC)   Low vitamin B12 level   Anemia  1 intractable nausea vomiting/failure to thrive Questionable etiology.  Patient does state has been having intractable nausea and vomiting daily over the past 3 weeks and unable to keep anything down.  Patient with decreased appetite and decreased oral intake.  Patient with a 40 pound weight loss over the past month with some associated fevers and chills and diarrhea.  Patient with recent CT scan done on 11/14/2018 with no acute abnormalities explaining patient's symptoms.  CT showing prostatectomy, pelvic sidewall lymph nodes, retroperitoneal lymph nodes, splenomegaly, simple appearing hepatic cysts, single slightly irregular sclerotic lesion in the T12 vertebral body indeterminate, three-vessel coronary artery calcifications noted along with distal aortic and iliac artery calcifications.  Chest x-ray done negative for any acute abnormalities.  C. difficile PCR pending as patient did state he been having bouts of diarrhea over the past several days prior to admission.   Patient with no diarrhea since admission.  Discontinue C. difficile PCR.  Discontinue contact precautions.  CT head negative.  Blood cultures pending.  Urinalysis unremarkable.  Patient tolerated clear liquids however stated had a bout of emesis this morning after eating some cereal.  Patient also on presentation noted to have a 40 pound weight loss in addition to nausea vomiting and loose stools.  Change diet to a clear liquid diet.  Consult with GI for further evaluation and management.  Continue IV fluids, antiemetics, supportive care.  2.  Acute renal failure On admission patient noted to have a creatinine of 2.43.  Last creatinine in the system was 1.13 on 02/18/2016.  Likely secondary to prerenal azotemia due to decreased oral intake, intractable nausea and vomiting.  Urinalysis nitrite negative with 30 of protein.  Fractional excretion of sodium was 0.61%.  Renal ultrasound negative for hydronephrosis.  Creatinine currently at 2.0 from 2.22.  Patient with a urine output of 2.6 L over the past 24 hours.  Continue IV fluids.  Follow.  3.  Transaminitis Questionable etiology.  Patient noted to have elevated LFTs.  Patient with nausea vomiting poor oral intake and on a statin.  Patient with recent CT abdomen and pelvis done on 11/14/2018 which showed simple appearing hepatic cyst.  LFTs trending down.  Continue IV fluids and follow.  If no significant improvement will check an acute hepatitis panel.  Patient however did state has a history of hepatitis since childhood.  Continue to hold statin.  4.  Anemia/low vitamin B12 levels Patient with a hemoglobin of 9.0 on admission.  Last hemoglobin was 14.7 on 02/18/2016 per epic.  Hemoglobin this morning  is 8.0. Likely dilutional component.  Patient denies any overt bleeding.  Anemia panel with a iron of 18, TIBC of 144, ferritin of 853, folate of 6.6.  Vitamin B12 of 198.  Status post IV Feraheme x1.  Vitamin B12 1000 MCG's IM daily x1 week, and then weekly x1  month, and then monthly.  Follow H&H.  Transfuse as needed. Patient also noted to have presented with GI symptoms of nausea vomiting diarrhea with a 40 pound weight loss over the past month.  Consult with GI for further evaluation and management.  Will likely need upper endoscopy.  5.  Hypokalemia Repleted.  6.  Dehydration Continue IV fluids.  7.  Orthostasis Likely secondary to problem #1. Orthostasis improving with hydration.  Follow.   8.  Fever Questionable etiology.  Patient has been pancultured cultures pending.  Patient afebrile during this hospitalization.  Patient noted to have loose stools per patient on admission and C. difficile PCR pending.  No bowel movement per RN.   Urinalysis nitrite negative leukocytes negative.  Discontinue enteric precautions.  Continue antipyretics, IV fluids, supportive care.  No need for antibiotics at this time.  Follow.   9.  History of prostate cancer status post prostatectomy Receiving radiation treatment.  Per radiation oncology.  10.  Hyperlipidemia Patient noted to have elevated LFTs.  LFTs slowly trending down.  Continue IV fluids.  Continue to hold statin.    DVT prophylaxis: Lovenox/SCDs Code Status: Full Family Communication: Updated patient.  Updated wife. Disposition Plan: Remain inpatient for now as patient presented with intractable nausea vomiting requiring ongoing IV fluids, and acute renal failure.    Consultants:  Gastroenterology pending 11/17/2018  Procedures:   Renal ultrasound 11/15/2018  Chest x-ray 11/15/2018  CT head 11/15/2018  Antimicrobials:   None   Subjective: Patient sitting up in bed.  Stated had an episode of emesis this morning after eating cereal.  Nausea improving.  No diarrhea.  No abdominal pain.  No chest pain.  No shortness of breath.   Objective: Vitals:   11/16/18 1157 11/16/18 1158 11/16/18 2148 11/17/18 0602  BP: 134/78 118/70 137/80 139/74  Pulse: 93 97 93 92  Resp:   15 15    Temp:   98.7 F (37.1 C) 98.9 F (37.2 C)  TempSrc:   Oral Oral  SpO2:   96% 96%  Weight:    106.6 kg  Height:        Intake/Output Summary (Last 24 hours) at 11/17/2018 1137 Last data filed at 11/17/2018 1038 Gross per 24 hour  Intake 3188.43 ml  Output 2550 ml  Net 638.43 ml   Filed Weights   11/15/18 1711 11/16/18 0852 11/17/18 0602  Weight: 101 kg 102.9 kg 106.6 kg    Examination:  General exam: NAD Respiratory system: Lungs clear to auscultation bilaterally.  No wheezes, no crackles, no rhonchi.  Cardiovascular system: Regular rate rhythm no murmurs rubs or gallops.  No JVD.  No lower extremity edema. Gastrointestinal system: Abdomen is soft, nontender, nondistended, positive bowel sounds.  No rebound.  No guarding.  Central nervous system: Alert and oriented. No focal neurological deficits. Extremities: Symmetric 5 x 5 power. Skin: No rashes, lesions or ulcers Psychiatry: Judgement and insight appear normal. Mood & affect appropriate.     Data Reviewed: I have personally reviewed following labs and imaging studies  CBC: Recent Labs  Lab 11/15/18 1841 11/16/18 0451 11/17/18 0513  WBC 5.8 6.5 5.1  NEUTROABS 4.9  --  4.3  HGB  9.0* 8.6* 8.0*  HCT 30.1* 28.9* 26.7*  MCV 86.7 87.0 87.0  PLT 276 260 350   Basic Metabolic Panel: Recent Labs  Lab 11/15/18 1841 11/16/18 0451 11/17/18 0513  NA 138 139 136  K 3.4* 3.8 3.6  CL 107 111 109  CO2 21* 21* 21*  GLUCOSE 120* 129* 109*  BUN 31* 30* 26*  CREATININE 2.43* 2.22* 2.00*  CALCIUM 8.4* 8.3* 8.0*  MG 1.8 2.4 1.9  PHOS 3.9  --   --    GFR: Estimated Creatinine Clearance: 48 mL/min (A) (by C-G formula based on SCr of 2 mg/dL (H)). Liver Function Tests: Recent Labs  Lab 11/15/18 1841 11/17/18 0513  AST 75* 56*  ALT 128* 102*  ALKPHOS 387* 336*  BILITOT 0.8 1.0  PROT 6.3* 5.7*  ALBUMIN 2.3* 2.2*   No results for input(s): LIPASE, AMYLASE in the last 168 hours. No results for input(s): AMMONIA in  the last 168 hours. Coagulation Profile: No results for input(s): INR, PROTIME in the last 168 hours. Cardiac Enzymes: No results for input(s): CKTOTAL, CKMB, CKMBINDEX, TROPONINI in the last 168 hours. BNP (last 3 results) No results for input(s): PROBNP in the last 8760 hours. HbA1C: No results for input(s): HGBA1C in the last 72 hours. CBG: Recent Labs  Lab 11/16/18 0731 11/17/18 0732  GLUCAP 110* 100*   Lipid Profile: No results for input(s): CHOL, HDL, LDLCALC, TRIG, CHOLHDL, LDLDIRECT in the last 72 hours. Thyroid Function Tests: Recent Labs    11/15/18 1841  TSH 1.224   Anemia Panel: Recent Labs    11/16/18 0451  VITAMINB12 198  FOLATE 6.6  FERRITIN 853*  TIBC 144*  IRON 18*   Sepsis Labs: No results for input(s): PROCALCITON, LATICACIDVEN in the last 168 hours.  Recent Results (from the past 240 hour(s))  Culture, blood (Routine X 2) w Reflex to ID Panel     Status: None (Preliminary result)   Collection Time: 11/15/18  6:32 PM  Result Value Ref Range Status   Specimen Description BLOOD LEFT ANTECUBITAL  Final   Special Requests   Final    BOTTLES DRAWN AEROBIC AND ANAEROBIC Blood Culture adequate volume Performed at [facility] [address], [place], [place] [address]    Culture NO GROWTH 2 DAYS  Final   Report Status PENDING  Incomplete  Culture, blood (Routine X 2) w Reflex to ID Panel     Status: None (Preliminary result)   Collection Time: 11/15/18  6:41 PM  Result Value Ref Range Status   Specimen Description BLOOD RIGHT HAND  Final   Special Requests   Final    BOTTLES DRAWN AEROBIC ONLY Blood Culture adequate volume Performed at [facility] [address], [place], [place] [address]    Culture NO GROWTH 2 DAYS  Final   Report Status PENDING  Incomplete  Urine culture     Status: None   Collection Time: 11/15/18  8:21 PM  Result Value Ref Range Status   Specimen Description   Final    URINE,  RANDOM Performed at [facility], [place] [address] [place], [place] [address]    Special Requests   Final    NONE Performed at [facility], [place] [address] [place], [place] [address]    Culture   Final    NO GROWTH Performed at [facility], [place] [address] [place],  [address]    Report Status 11/17/2018 FINAL  Final         Radiology Studies: X-ray Chest Pa And Lateral  Result Date: 11/15/2018 CLINICAL DATA:  Fever EXAM: CHEST - 2 VIEW COMPARISON:  None. FINDINGS: No focal airspace disease or effusion. Normal heart size. Aortic atherosclerosis. No pneumothorax. IMPRESSION: No active cardiopulmonary disease. Electronically Signed   By: Donavan Foil M.D.   On: 11/15/2018 21:50   Ct Head Wo Contrast  Result Date: 11/15/2018 CLINICAL DATA:  Nausea and vomiting.  History of prostate cancer. EXAM: CT HEAD WITHOUT CONTRAST TECHNIQUE: Contiguous axial images were obtained from the base of the skull through the vertex without intravenous contrast. COMPARISON:  None. FINDINGS: BRAIN: No intraparenchymal hemorrhage, mass effect nor midline shift. No parenchymal brain volume loss for age. No hydrocephalus. No acute large vascular territory infarcts. No abnormal extra-axial fluid collections. Basal cisterns are patent. VASCULAR: Mild calcific atherosclerosis of the carotid siphons. SKULL: No skull fracture. Nonspecific subcentimeter sclerotic focus LEFT clivus. No significant scalp soft tissue swelling. SINUSES/ORBITS: Trace paranasal sinus mucosal thickening. Mastoid air cells are well aerated.The included ocular globes and orbital contents are non-suspicious. OTHER: None. IMPRESSION: 1. Negative noncontrast CT HEAD for age. 2. Clival bone island, less likely prostatic metastasis. Electronically Signed   By: Elon Alas M.D.   On: 11/15/2018 21:19   US Renal  Result Date: 11/15/2018 CLINICAL DATA:  Acute renal failure EXAM: RENAL / URINARY  TRACT ULTRASOUND COMPLETE COMPARISON:  CT 11/14/2018 FINDINGS: Right Kidney: Renal measurements: 11.4 x 6.6 x 6.7 cm = volume: 265 mL . Echogenicity within normal limits. No mass or hydronephrosis visualized. Left Kidney: Renal measurements: 12 x 5.7 x 6 cm = volume: 217 mL. Echogenicity within normal limits. No mass or hydronephrosis visualized. Bladder: Appears normal for degree of bladder distention. IMPRESSION: Negative renal ultrasound Electronically Signed   By: Donavan Foil M.D.   On: 11/15/2018 21:54        Scheduled Meds: . aspirin  81 mg Oral Daily  . cyanocobalamin  1,000 mcg Intramuscular Daily  . enoxaparin (LOVENOX) injection  40 mg Subcutaneous Q24H  . multivitamin  1 tablet Oral BID  . sodium chloride flush  3 mL Intravenous Q12H   Continuous Infusions: . sodium chloride 125 mL/hr at 11/16/18 2112     LOS: 2 days    Time spent: 35 mins    Irine Seal, MD [facility]  If 7PM-7AM, please contact night-coverage [email or web address] Password [facility] 11/17/2018, 11:37 AM

## 2018-11-17 NOTE — H&P (View-Only) (Signed)
Referring Provider: Dr. Grandville Silos Primary Care Physician:  Yevette Edwards, MD Primary Gastroenterologist:  Althia Forts  Reason for Consultation:  Nausea/Vomiting; Weight loss  HPI: Roy Joyce is a 68 y.o. male with history of prostate cancer on radiation therapy who was admitted for failure to thrive and 40 pound weight loss with recurrent nausea and vomiting. Reportedly has been having recurrent N/V for the past 3 weeks that he says only happens if he eats too fast. Has had a poor appetite since being on the radiation treatment and has unintentionally lost 40 pounds in the past month. Denies abdominal pain, dysphagia, heartburn, melena, or hematochezia. History of mesenteric panniculitis. Reports a normal colonoscopy 10 years ago at [facility].  Past Medical History:  Diagnosis Date  . Heart murmur   . Hepatitis 1962  . Hyperlipidemia   . Prostate cancer [facility])     Past Surgical History:  Procedure Laterality Date  . LYMPHADENECTOMY Bilateral 06/11/2015   Procedure: BILATERAL PELVIC NODE DISSECTION ;  Surgeon: Ardis Hughs, MD;  Location: [facility] ORS;  Service: Urology;  Laterality: Bilateral;  . MOUTH SURGERY    . ROBOT ASSISTED LAPAROSCOPIC RADICAL PROSTATECTOMY Bilateral 06/11/2015   Procedure: ROBOTIC ASSISTED LAPAROSCOPIC PROSTATECTOMY WITH ;  Surgeon: Ardis Hughs, MD;  Location: [facility];  Service: Urology;  Laterality: Bilateral;  . SKIN BIOPSY    . TONSILLECTOMY      Prior to Admission medications   Medication Sig Start Date End Date Taking? Authorizing Provider  acetaminophen (TYLENOL) 500 MG tablet Take 500-1,000 mg by mouth every 6 (six) hours as needed for mild pain or fever.   Yes [provider]  ASPIRIN 81 PO Take 81 mg by mouth daily.    Yes [provider]  Multiple Vitamins-Minerals (PRESERVISION AREDS 2+MULTI VIT PO) Take 1 tablet by mouth 2 (two) times daily.   Yes [provider]  rosuvastatin (CRESTOR) 10 MG  tablet Take 10 mg by mouth daily.    Yes [provider]  Cholecalciferol (VITAMIN D-3) 1000 units CAPS Take 1,000 Units by mouth daily.     [provider]  dronabinol (MARINOL) 2.5 MG capsule Take 1 capsule (2.5 mg total) by mouth 2 (two) times daily before a meal. Patient not taking: Reported on 11/15/2018 11/08/18   Tyler Pita, MD  Misc Natural Products (OSTEO BI-FLEX JOINT SHIELD PO) Take by mouth.    [provider]  Omega-3 Fatty Acids (FISH OIL) 1000 MG CAPS Take 1,000 mg by mouth daily.     [provider]  prochlorperazine (COMPAZINE) 10 MG tablet Take 1 tablet (10 mg total) by mouth every 6 (six) hours as needed for nausea or vomiting. Patient not taking: Reported on 11/15/2018 11/01/18   Tyler Pita, MD    Scheduled Meds: . aspirin  81 mg Oral Daily  . cyanocobalamin  1,000 mcg Intramuscular Daily  . enoxaparin (LOVENOX) injection  40 mg Subcutaneous Q24H  . multivitamin  1 tablet Oral BID  . sodium chloride flush  3 mL Intravenous Q12H   Continuous Infusions: . sodium chloride 125 mL/hr at 11/17/18 1310   PRN Meds:.acetaminophen **OR** acetaminophen, ketorolac, levalbuterol, magnesium citrate, ondansetron **OR** ondansetron (ZOFRAN) IV, polyethylene glycol, prochlorperazine, sorbitol, traMADol  Allergies as of 11/15/2018  . (No Known Allergies)    Family History  Problem Relation Age of Onset  . Lymphoma Father        dairy farmer/crops/pesticides  . Kidney cancer Neg Hx   . Prostate cancer Neg  Hx   . Bladder Cancer Neg Hx   . Breast cancer Neg Hx   . Pancreatic cancer Neg Hx   . Colon cancer Neg Hx     Social History   Socioeconomic History  . Marital status: Married    Spouse name: Not on file  . Number of children: Not on file  . Years of education: Not on file  . Highest education level: Not on file  Occupational History  . Not on file  Social Needs  . Financial resource strain: Not on file  . Food  insecurity:    Worry: Not on file    Inability: Not on file  . Transportation needs:    Medical: Not on file    Non-medical: Not on file  Tobacco Use  . Smoking status: Former Smoker    Packs/day: 0.50    Years: 30.00    Pack years: 15.00    Types: Cigarettes    Last attempt to quit: 03/08/2005    Years since quitting: 13.7  . Smokeless tobacco: Never Used  . Tobacco comment: quit 10 years/camel non filters  Substance and Sexual Activity  . Alcohol use: Yes    Alcohol/week: 2.0 standard drinks    Types: 2 Standard drinks or equivalent per week    Comment: ocassional  . Drug use: No  . Sexual activity: Yes    Comment: with aid of Trimix and vaccum device  Lifestyle  . Physical activity:    Days per week: Not on file    Minutes per session: Not on file  . Stress: Not on file  Relationships  . Social connections:    Talks on phone: Not on file    Gets together: Not on file    Attends religious service: Not on file    Active member of club or organization: Not on file    Attends meetings of clubs or organizations: Not on file    Relationship status: Not on file  . Intimate partner violence:    Fear of current or ex partner: Not on file    Emotionally abused: Not on file    Physically abused: Not on file    Forced sexual activity: Not on file  Other Topics Concern  . Not on file  Social History Narrative  . Not on file    Review of Systems: All negative except as stated above in HPI.  Physical Exam: Vital signs: Vitals:   11/17/18 0602 11/17/18 1308  BP: 139/74 (!) 143/73  Pulse: 92 85  Resp: 15 16  Temp: 98.9 F (37.2 C) 97.8 F (36.6 C)  SpO2: 96% 96%   Last BM Date: 11/15/18 General:   Lethargic, elderly, Well-developed, well-nourished, pleasant and cooperative in NAD Head: normocephalic, atraumatic Eyes: anicteric sclera ENT: oropharynx clear Neck: supple, nontender Lungs:  Clear throughout to auscultation.   No wheezes, crackles, or rhonchi. No acute  distress. Heart:  Regular rate and rhythm; no murmurs, clicks, rubs,  or gallops. Abdomen: soft, nontender, nondistended, +BS  Rectal:  Deferred Ext: +LE pitting edema  GI:  Lab Results: Recent Labs    11/15/18 1841 11/16/18 0451 11/17/18 0513  WBC 5.8 6.5 5.1  HGB 9.0* 8.6* 8.0*  HCT 30.1* 28.9* 26.7*  PLT 276 260 244   BMET Recent Labs    11/15/18 1841 11/16/18 0451 11/17/18 0513  NA 138 139 136  K 3.4* 3.8 3.6  CL 107 111 109  CO2 21* 21* 21*  GLUCOSE 120* 129*  109*  BUN 31* 30* 26*  CREATININE 2.43* 2.22* 2.00*  CALCIUM 8.4* 8.3* 8.0*   LFT Recent Labs    11/17/18 0513  PROT 5.7*  ALBUMIN 2.2*  AST 56*  ALT 102*  ALKPHOS 336*  BILITOT 1.0   PT/INR No results for input(s): LABPROT, INR in the last 72 hours.   Studies/Results: X-ray Chest Pa And Lateral  Result Date: 11/15/2018 CLINICAL DATA:  Fever EXAM: CHEST - 2 VIEW COMPARISON:  None. FINDINGS: No focal airspace disease or effusion. Normal heart size. Aortic atherosclerosis. No pneumothorax. IMPRESSION: No active cardiopulmonary disease. Electronically Signed   By: Donavan Foil M.D.   On: 11/15/2018 21:50   Ct Head Wo Contrast  Result Date: 11/15/2018 CLINICAL DATA:  Nausea and vomiting.  History of prostate cancer. EXAM: CT HEAD WITHOUT CONTRAST TECHNIQUE: Contiguous axial images were obtained from the base of the skull through the vertex without intravenous contrast. COMPARISON:  None. FINDINGS: BRAIN: No intraparenchymal hemorrhage, mass effect nor midline shift. No parenchymal brain volume loss for age. No hydrocephalus. No acute large vascular territory infarcts. No abnormal extra-axial fluid collections. Basal cisterns are patent. VASCULAR: Mild calcific atherosclerosis of the carotid siphons. SKULL: No skull fracture. Nonspecific subcentimeter sclerotic focus LEFT clivus. No significant scalp soft tissue swelling. SINUSES/ORBITS: Trace paranasal sinus mucosal thickening. Mastoid air cells are well  aerated.The included ocular globes and orbital contents are non-suspicious. OTHER: None. IMPRESSION: 1. Negative noncontrast CT HEAD for age. 2. Clival bone island, less likely prostatic metastasis. Electronically Signed   By: Elon Alas M.D.   On: 11/15/2018 21:19   US Renal  Result Date: 11/15/2018 CLINICAL DATA:  Acute renal failure EXAM: RENAL / URINARY TRACT ULTRASOUND COMPLETE COMPARISON:  CT 11/14/2018 FINDINGS: Right Kidney: Renal measurements: 11.4 x 6.6 x 6.7 cm = volume: 265 mL . Echogenicity within normal limits. No mass or hydronephrosis visualized. Left Kidney: Renal measurements: 12 x 5.7 x 6 cm = volume: 217 mL. Echogenicity within normal limits. No mass or hydronephrosis visualized. Bladder: Appears normal for degree of bladder distention. IMPRESSION: Negative renal ultrasound Electronically Signed   By: Donavan Foil M.D.   On: 11/15/2018 21:54    Impression/Plan: Recurrent nausea and vomiting and weight loss on radiation treatment for prostate cancer. No history of GI bleeding or abdominal pain. I do not think his reported mesenteric panniculitis is causing his symptoms but suspect the radiation treatment is the main source. He does need an EGD to evaluate for peptic ulcer disease. NPO p MN for EGD tomorrow by Dr. Alessandra Bevels.    LOS: 2 days   Lear Ng  11/17/2018, 2:20 PM  Questions please call [phone]

## 2018-11-18 ENCOUNTER — Encounter (HOSPITAL_COMMUNITY)
Admission: EM | Disposition: A | Discharge status: Home / Self Care | Payer: Self-pay | Source: Ambulatory Visit | Attending: Internal Medicine

## 2018-11-18 ENCOUNTER — Encounter (HOSPITAL_COMMUNITY): Payer: Self-pay | Admitting: Anesthesiology

## 2018-11-18 ENCOUNTER — Inpatient Hospital Stay (HOSPITAL_COMMUNITY): Payer: Managed Care, Other (non HMO) | Admitting: Anesthesiology

## 2018-11-18 ENCOUNTER — Ambulatory Visit: Payer: Managed Care, Other (non HMO)

## 2018-11-18 HISTORY — PX: BIOPSY: SHX5522

## 2018-11-18 HISTORY — PX: ESOPHAGOGASTRODUODENOSCOPY (EGD) WITH PROPOFOL: SHX5813

## 2018-11-18 LAB — CBC
HCT: 27.9 % — ABNORMAL LOW (ref 39.0–52.0)
Hemoglobin: 8.4 g/dL — ABNORMAL LOW (ref 13.0–17.0)
MCH: 26.3 pg (ref 26.0–34.0)
MCHC: 30.1 g/dL (ref 30.0–36.0)
MCV: 87.2 fL (ref 80.0–100.0)
PLATELETS: 251 10*3/uL (ref 150–400)
RBC: 3.2 MIL/uL — ABNORMAL LOW (ref 4.22–5.81)
RDW: 17 % — ABNORMAL HIGH (ref 11.5–15.5)
WBC: 6.3 10*3/uL (ref 4.0–10.5)
nRBC: 0 % (ref 0.0–0.2)

## 2018-11-18 LAB — COMPREHENSIVE METABOLIC PANEL
ALT: 87 U/L — ABNORMAL HIGH (ref 0–44)
AST: 44 U/L — ABNORMAL HIGH (ref 15–41)
Albumin: 2 g/dL — ABNORMAL LOW (ref 3.5–5.0)
Alkaline Phosphatase: 321 U/L — ABNORMAL HIGH (ref 38–126)
Anion gap: 8 (ref 5–15)
BUN: 23 mg/dL (ref 8–23)
CO2: 21 mmol/L — ABNORMAL LOW (ref 22–32)
Calcium: 8.1 mg/dL — ABNORMAL LOW (ref 8.9–10.3)
Chloride: 109 mmol/L (ref 98–111)
Creatinine, Ser: 1.99 mg/dL — ABNORMAL HIGH (ref 0.61–1.24)
GFR calc Af Amer: 39 mL/min — ABNORMAL LOW (ref >60)
GFR, EST NON AFRICAN AMERICAN: 34 mL/min — AB (ref >60)
Glucose, Bld: 108 mg/dL — ABNORMAL HIGH (ref 70–99)
Potassium: 3.7 mmol/L (ref 3.5–5.1)
Sodium: 138 mmol/L (ref 135–145)
Total Bilirubin: 0.9 mg/dL (ref 0.3–1.2)
Total Protein: 5.7 g/dL — ABNORMAL LOW (ref 6.5–8.1)

## 2018-11-18 LAB — GLUCOSE, CAPILLARY: Glucose-Capillary: 98 mg/dL (ref 70–99)

## 2018-11-18 SURGERY — ESOPHAGOGASTRODUODENOSCOPY (EGD) WITH PROPOFOL
Anesthesia: Monitor Anesthesia Care

## 2018-11-18 MED ORDER — LIDOCAINE 2% (20 MG/ML) 5 ML SYRINGE
INTRAMUSCULAR | Status: AC
  Filled 2018-11-18: qty 5

## 2018-11-18 MED ORDER — EPHEDRINE 5 MG/ML INJ
INTRAVENOUS | Status: AC
  Filled 2018-11-18: qty 10

## 2018-11-18 MED ORDER — CEFAZOLIN SODIUM-DEXTROSE 2-4 GM/100ML-% IV SOLN
2.0000 g | Freq: Three times a day (TID) | INTRAVENOUS | Status: DC
  Administered 2018-11-18 – 2018-11-19 (×2): 2 g via INTRAVENOUS
  Filled 2018-11-18 (×3): qty 100

## 2018-11-18 MED ORDER — PHENYLEPHRINE 40 MCG/ML (10ML) SYRINGE FOR IV PUSH (FOR BLOOD PRESSURE SUPPORT)
PREFILLED_SYRINGE | INTRAVENOUS | Status: AC
  Filled 2018-11-18: qty 10

## 2018-11-18 MED ORDER — PROPOFOL 10 MG/ML IV BOLUS
INTRAVENOUS | Status: AC
  Filled 2018-11-18: qty 40

## 2018-11-18 MED ORDER — PANTOPRAZOLE SODIUM 40 MG IV SOLR
40.0000 mg | Freq: Two times a day (BID) | INTRAVENOUS | Status: DC
  Administered 2018-11-18 – 2018-11-19 (×3): 40 mg via INTRAVENOUS
  Filled 2018-11-18 (×3): qty 40

## 2018-11-18 MED ORDER — ROCURONIUM BROMIDE 100 MG/10ML IV SOLN
INTRAVENOUS | Status: AC
  Filled 2018-11-18: qty 1

## 2018-11-18 MED ORDER — PROPOFOL 10 MG/ML IV BOLUS
INTRAVENOUS | Status: DC | PRN
  Administered 2018-11-18 (×2): 20 mg via INTRAVENOUS

## 2018-11-18 MED ORDER — PROPOFOL 500 MG/50ML IV EMUL
INTRAVENOUS | Status: DC | PRN
  Administered 2018-11-18: 150 ug/kg/min via INTRAVENOUS

## 2018-11-18 MED ORDER — STERILE WATER FOR INJECTION IJ SOLN
INTRAMUSCULAR | Status: AC
  Administered 2018-11-18: 23:00:00
  Filled 2018-11-18: qty 10

## 2018-11-18 SURGICAL SUPPLY — 15 items

## 2018-11-18 NOTE — Brief Op Note (Signed)
11/15/2018 - 11/18/2018  10:31 AM  PATIENT:  Roy Joyce  68 y.o. male  PRE-OPERATIVE DIAGNOSIS:  nausea/vomiting / weight loss  POST-OPERATIVE DIAGNOSIS:  esophagitis gastritis   PROCEDURE:  Procedure(s): ESOPHAGOGASTRODUODENOSCOPY (EGD) WITH PROPOFOL (N/A) BIOPSY  SURGEON:  Surgeon(s) and Role:    * Lynn Sissel, MD - Primary  Findings --------- -EGD showed LA Grade B esophagitis and gastritis with gastric erosions.  Gastric biopsies taken.  Recommendations -------------------------- -Start IV twice daily PPI - diet - advance as tolerated -Repeat EGD in 2 months to document healing of esophagitis. - GI will follow  Otis Brace MD, [place] 11/18/2018, 10:32 AM  Contact #  [phone]

## 2018-11-18 NOTE — Interval H&P Note (Signed)
History and Physical Interval Note:  11/18/2018 9:53 AM  Roy Joyce  has presented today for surgery, with the diagnosis of nausea/vomiting / weight loss  The various methods of treatment have been discussed with the patient and family. After consideration of risks, benefits and other options for treatment, the patient has consented to  Procedure(s): ESOPHAGOGASTRODUODENOSCOPY (EGD) WITH PROPOFOL (N/A) as a surgical intervention .  The patient's history has been reviewed, patient examined, no change in status, stable for surgery.  I have reviewed the patient's chart and labs.  Questions were answered to the patient's satisfaction.    Risks (bleeding, infection, bowel perforation that could require surgery, sedation-related changes in cardiopulmonary systems), benefits (identification and possible treatment of source of symptoms, exclusion of certain causes of symptoms), and alternatives (watchful waiting, radiographic imaging studies, empiric medical treatment)  were explained to patient/family in detail and patient wishes to proceed.  Roy Joyce

## 2018-11-18 NOTE — Progress Notes (Signed)
Initial Nutrition Assessment  INTERVENTION:   Patient to request supplements from unit  NUTRITION DIAGNOSIS:   Inadequate oral intake related to nausea, vomiting as evidenced by per patient/family report.  GOAL:   Patient will meet greater than or equal to 90% of their needs  MONITOR:   PO intake, Labs, Weight trends, I & O's  REASON FOR ASSESSMENT:   Malnutrition Screening Tool    ASSESSMENT:   68 year old gentleman history of hyperlipidemia, prostate cancer status post prostatectomy 2016 undergoing radiation treatment to prostate fossa per Dr. Tammi Klippel, presented to radiation oncology clinic noted to have failure to thrive, 40 pound weight loss over the past month, orthostatic hypotension and tachycardia with intractable nausea and vomiting for the past 3 weeks with decreased oral intake.  Patient admitted and noted to be in acute renal failure, anemic. 2/24: s/p EGD -shows gastritis, esophagitis  Patient reports not eating well for ~3 weeks PTA d/t N/V. He was unable to keep any solid food down during that time but was able to tolerate some liquids. Pt tried to drink Ensure supplements but now does not prefer them. Suggested other supplements to try this admission but pt not interested at this time.   Patient's diet was advanced to full liquids today. States he has had mostly clears and was working on chocolate pudding during visit.   Per patient UBW is 260 lb. Per records, pt has lost 40 lb since 07/10/18 (15% wt loss x 4 months, significant for time frame).   Medications: IV Vitamin B-12 daily, MVI BID Labs reviewed: GFR: 34   NUTRITION - FOCUSED PHYSICAL EXAM:  Nutrition focused physical exam shows no sign of depletion of muscle mass or body fat.  Diet Order:   Diet Order            Diet full liquid Room service appropriate? Yes; Fluid consistency: Thin  Diet effective now              EDUCATION NEEDS:   Education needs have been addressed  Skin:  Skin  Assessment: Reviewed RN Assessment  Last BM:  2/21  Height:   Ht Readings from Last 1 Encounters:  11/18/18 6\' 4"  (1.93 m)    Weight:   Wt Readings from Last 1 Encounters:  11/18/18 107.2 kg    Ideal Body Weight:  90.5 kg  BMI:  Body mass index is 28.77 kg/m.  Estimated Nutritional Needs:   Kcal:  8264-1583  Protein:  105-115g  Fluid:  2.3L/day  Clayton Bibles, MS, RD, LDN [place] Dietitian Pager: [phone] After Hours Pager: [phone]

## 2018-11-18 NOTE — Op Note (Signed)
[facility] Patient Name: Roy Joyce Procedure Date: 11/18/2018 MRN: [id number] Attending MD: Otis Brace , MD Date of Birth: 20-Sep-1951 CSN: [id number] Age: 68 Admit Type: Inpatient Procedure:                Upper GI endoscopy Indications:              Nausea with vomiting Providers:                Otis Brace, MD, Carmie End, RN,                            William Dalton, Technician Referring MD:              Medicines:                Sedation Administered by an Anesthesia Professional Complications:            No immediate complications. Estimated Blood Loss:     Estimated blood loss was minimal. Procedure:                Pre-Anesthesia Assessment:                           - Prior to the procedure, a History and Physical                            was performed, and patient medications and                            allergies were reviewed. The patient's tolerance of                            previous anesthesia was also reviewed. The risks                            and benefits of the procedure and the sedation                            options and risks were discussed with the patient.                            All questions were answered, and informed consent                            was obtained. Prior Anticoagulants: The patient has                            taken no previous anticoagulant or antiplatelet                            agents. ASA Grade Assessment: II - A patient with                            mild systemic disease. After reviewing the risks  and benefits, the patient was deemed in                            satisfactory condition to undergo the procedure.                           After obtaining informed consent, the endoscope was                            passed under direct vision. Throughout the                            procedure, the patient's blood pressure, pulse, and                oxygen saturations were monitored continuously. The                            GIF-H190 ([id number]) Olympus gastroscope was                            introduced through the mouth, and advanced to the                            second part of duodenum. The upper GI endoscopy was                            accomplished without difficulty. The patient                            tolerated the procedure well. Scope In: Scope Out: Findings:      LA Grade B (one or more mucosal breaks greater than 5 mm, not extending       between the tops of two mucosal folds) esophagitis with no bleeding was       found at the gastroesophageal junction.      The exam of the esophagus was otherwise normal.      Multiple dispersed, small non-bleeding erosions were found in the       gastric body and at the pylorus. There were no stigmata of recent       bleeding. Biopsies were taken with a cold forceps for histology.      The cardia and gastric fundus were normal on retroflexion.      The duodenal bulb, first portion of the duodenum and second portion of       the duodenum were normal. Impression:               - LA Grade B reflux esophagitis.                           - Non-bleeding erosive gastropathy. Biopsied.                           - Normal duodenal bulb, first portion of the                            duodenum and second portion of  the duodenum. Moderate Sedation:      Moderate (conscious) sedation was personally administered by an       anesthesia professional. The following parameters were monitored: oxygen       saturation, heart rate, blood pressure, and response to care. Recommendation:           - Return patient to hospital ward for ongoing care.                           - Resume previous diet.                           - Use Protonix (pantoprazole) 40 mg PO BID.                           - Repeat upper endoscopy in 2 months to check                            healing. Procedure  Code(s):        --- Professional ---                           [phone], Esophagogastroduodenoscopy, flexible,                            transoral; with biopsy, single or multiple Diagnosis Code(s):        --- Professional ---                           K21.0, Gastro-esophageal reflux disease with                            esophagitis                           K31.89, Other diseases of stomach and duodenum                           R11.2, Nausea with vomiting, unspecified CPT copyright 2018 American Medical Association. All rights reserved. The codes documented in this report are preliminary and upon coder review may  be revised to meet current compliance requirements. Otis Brace, MD Otis Brace, MD 11/18/2018 10:31:07 AM Number of Addenda: 0

## 2018-11-18 NOTE — Anesthesia Preprocedure Evaluation (Addendum)
Anesthesia Evaluation  Patient identified by MRN, date of birth, ID band Patient awake    Reviewed: Allergy & Precautions, NPO status , Patient's Chart, lab work & pertinent test results  Airway Mallampati: I  TM Distance: >3 FB Neck ROM: Full    Dental  (+) Edentulous Upper, Edentulous Lower   Pulmonary former smoker,    breath sounds clear to auscultation       Cardiovascular negative cardio ROS   Rhythm:Regular Rate:Normal     Neuro/Psych negative neurological ROS     GI/Hepatic negative GI ROS, (+) Hepatitis -  Endo/Other  negative endocrine ROS  Renal/GU Renal InsufficiencyRenal disease     Musculoskeletal negative musculoskeletal ROS (+)   Abdominal Normal abdominal exam  (+)   Peds  Hematology negative hematology ROS (+)   Anesthesia Other Findings   Reproductive/Obstetrics                            Anesthesia Physical Anesthesia Plan  ASA: II  Anesthesia Plan: MAC   Post-op Pain Management:    Induction: Intravenous  PONV Risk Score and Plan: 0 and Propofol infusion  Airway Management Planned: Natural Airway and Nasal Cannula  Additional Equipment: None  Intra-op Plan:   Post-operative Plan:   Informed Consent: I have reviewed the patients History and Physical, chart, labs and discussed the procedure including the risks, benefits and alternatives for the proposed anesthesia with the patient or authorized representative who has indicated his/her understanding and acceptance.       Plan Discussed with: CRNA  Anesthesia Plan Comments:        Anesthesia Quick Evaluation

## 2018-11-18 NOTE — Transfer of Care (Signed)
Immediate Anesthesia Transfer of Care Note  Patient: Roy Joyce  Procedure(s) Performed: ESOPHAGOGASTRODUODENOSCOPY (EGD) WITH PROPOFOL (N/A ) BIOPSY  Patient Location: PACU and Endoscopy Unit  Anesthesia Type:MAC  Level of Consciousness: awake, drowsy and responds to stimulation  Airway & Oxygen Therapy: Patient Spontanous Breathing and Patient connected to nasal cannula oxygen  Post-op Assessment: Report given to RN and Post -op Vital signs reviewed and stable  Post vital signs: Reviewed and stable  Last Vitals:  Vitals Value Taken Time  BP    Temp    Pulse 93 11/18/2018 10:28 AM  Resp 26 11/18/2018 10:28 AM  SpO2 99 % 11/18/2018 10:28 AM  Vitals shown include unvalidated device data.  Last Pain:  Vitals:   11/18/18 0946  TempSrc: Oral  PainSc: 0-No pain         Complications: No apparent anesthesia complications

## 2018-11-18 NOTE — Progress Notes (Signed)
Pt feeling poorly and continues to have nausea and vomiting. He has continued to lose weight and is tachycardiac. Dr. Tammi Klippel has requested that he be admitted for further evaluation. I made multiple phone calls to obtain CT reports and records from [facility]. Radiology at [facility] will power share the recent biopsy CT to [facility].

## 2018-11-18 NOTE — Progress Notes (Signed)
[facility]  Patient Status: Active (receiving services up to time of hospitalization)  [facility] is providing the following services: RN and Home Infusion Services (teaching and education will be done by nurse in the home with patient and caregiver)  If patient discharges after hours, please call [phone].   Edwinna Areola 11/18/2018, 9:07 AM

## 2018-11-18 NOTE — Anesthesia Procedure Notes (Signed)
Procedure Name: MAC Date/Time: 11/18/2018 10:11 AM Performed by: Lollie Sails, CRNA Pre-anesthesia Checklist: Patient identified, Emergency Drugs available, Suction available, Patient being monitored and Timeout performed Oxygen Delivery Method: Nasal cannula

## 2018-11-18 NOTE — Progress Notes (Signed)
Friends in visiting

## 2018-11-18 NOTE — Anesthesia Postprocedure Evaluation (Signed)
Anesthesia Post Note  Patient: Arvel Oquinn  Procedure(s) Performed: ESOPHAGOGASTRODUODENOSCOPY (EGD) WITH PROPOFOL (N/A ) BIOPSY     Patient location during evaluation: Endoscopy Anesthesia Type: MAC Level of consciousness: awake and alert Pain management: pain level controlled Vital Signs Assessment: post-procedure vital signs reviewed and stable Respiratory status: spontaneous breathing, nonlabored ventilation, respiratory function stable and patient connected to nasal cannula oxygen Cardiovascular status: stable and blood pressure returned to baseline Postop Assessment: no apparent nausea or vomiting Anesthetic complications: no    Last Vitals:  Vitals:   11/18/18 1040 11/18/18 1050  BP: 131/80 (!) 151/79  Pulse: (!) 107 97  Resp: (!) 21 (!) 25  Temp:    SpO2: 98% 98%    Last Pain:  Vitals:   11/18/18 1050  TempSrc:   PainSc: 0-No pain                 Catalina Gravel

## 2018-11-18 NOTE — Progress Notes (Signed)
SLP Cancellation Note  Patient Details Name: Federico Maiorino MRN: [id number] DOB: July 20, 1951   Cancelled treatment:       Reason Eval/Treat Not Completed: Patient at procedure or test/unavailable(pt npo at this time, ? for EGD, will continue efforts)   Macario Golds 11/18/2018, 9:14 AM   Luanna Salk, [place] SLP Acute Rehab Services Pager [phone] Office [phone]

## 2018-11-18 NOTE — Progress Notes (Addendum)
PROGRESS NOTE    Roy Joyce  PYK:[id number] DOB: 1951-02-01 DOA: 11/15/2018 PCP: Yevette Edwards, MD   Brief Narrative:  Patient is a 68 year old gentleman history of hyperlipidemia, prostate cancer status post prostatectomy 2016 undergoing radiation treatment to prostate fossa per Dr. Tammi Klippel, presented to radiation oncology clinic noted to have failure to thrive, 40 pound weight loss over the past month, orthostatic hypotension and tachycardia with intractable nausea and vomiting for the past 3 weeks with decreased oral intake.  Patient admitted and noted to be in acute renal failure, anemic.  Patient placed on IV fluids.   Assessment & Plan:   Principal Problem:   Intractable nausea and vomiting Active Problems:   Hyperlipidemia   Prostate cancer ([place])   H/O radical prostatectomy   Failure to thrive in adult   Orthostasis   Dehydration   Fever   ARF (acute renal failure) (HCC)   Low vitamin B12 level   Anemia  1 intractable nausea vomiting/failure to thrive Questionable etiology.  Patient does state has been having intractable nausea and vomiting daily over the past 3 weeks and unable to keep anything down prior to admission.  Patient with decreased appetite and decreased oral intake.  Patient with a 40 pound weight loss over the past month with some associated fevers and chills and diarrhea.  Patient with recent CT scan done on 11/14/2018 with no acute abnormalities explaining patient's symptoms.  CT showing prostatectomy, pelvic sidewall lymph nodes, retroperitoneal lymph nodes, splenomegaly, simple appearing hepatic cysts, single slightly irregular sclerotic lesion in the T12 vertebral body indeterminate, three-vessel coronary artery calcifications noted along with distal aortic and iliac artery calcifications.  Chest x-ray done negative for any acute abnormalities.  C. difficile PCR pending as patient did state he been having bouts of diarrhea over the past several days prior  to admission.  Patient with no diarrhea since admission.  Discontinue C. difficile PCR.  Discontinued contact precautions.  CT head negative.  Blood cultures pending with no growth to date.  Urinalysis unremarkable.  Patient tolerated clear liquids however stated had a bout of emesis the morning of 11/17/2018, after eating some cereal.  Patient also on presentation noted to have a 40 pound weight loss in addition to nausea vomiting and loose stools.  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  Patient for upper endoscopy today.  Decrease IV fluids to 75 cc/h.  Continue antiemetics and supportive care.   2.  Acute renal failure On admission patient noted to have a creatinine of 2.43.  Last creatinine in the system was 1.13 on 02/18/2016.  Likely secondary to prerenal azotemia due to decreased oral intake, intractable nausea and vomiting.  Urinalysis nitrite negative with 30 of protein.  Fractional excretion of sodium was 0.61%.  Renal ultrasound negative for hydronephrosis.  Creatinine currently at 1.99 from 2.0 from 2.22.  Patient with a urine output of 4.9 L over the past 24 hours.  Decrease IV fluids to 75 cc/h.  Post endoscopy and was started on a diet low saline lock IV fluids.  Will likely need outpatient follow-up.  Renal function likely slowly to equilibrate.  Discussed with nephrology.  Follow.  3.  Transaminitis Questionable etiology.  Patient noted to have elevated LFTs.  Patient with nausea vomiting poor oral intake and on a statin.  Patient with recent CT abdomen and pelvis done on 11/14/2018 which showed simple  appearing hepatic cyst.  LFTs trending down.  Decrease IV fluids.  If no significant improvement or worsening, will check an acute hepatitis panel.  Patient however did state has a history of hepatitis since childhood.  Continue to hold statin.  4.   Anemia/low vitamin B12 levels Patient with a hemoglobin of 9.0 on admission.  Last hemoglobin was 14.7 on 02/18/2016 per epic.  Hemoglobin this morning is 8.4. Likely dilutional component.  Patient denies any overt bleeding.  Anemia panel with a iron of 18, TIBC of 144, ferritin of 853, folate of 6.6.  Vitamin B12 of 198.  Status post IV Feraheme x1.  Vitamin B12 1000 MCG's IM daily x1 week, and then weekly x1 month, and then monthly.  Follow H&H.  Transfuse as needed. Patient also noted to have presented with GI symptoms of nausea vomiting diarrhea with a 40 pound weight loss over the past month.  Patient has been seen in consultation by gastroenterology and patient for upper endoscopy this morning.   5.  Hypokalemia Repleted.  6.  Dehydration Decrease IV fluids to 75 cc/h.  7.  Orthostasis Likely secondary to problem #1. Orthostasis improving with hydration.  Decrease IV fluids to 75 cc/h and once back on a diet will saline lock IV fluids.  Follow.   8.  Fever Questionable etiology.  Patient has been pancultured cultures pending with no growth to date.  Patient afebrile during this hospitalization.  Patient noted to have loose stools per patient on admission and C. difficile PCR pending.  No bowel movement per RN.   DC C. difficile PCR.  Enteric precautions have been discontinued.  Urinalysis nitrite negative leukocytes negative.  Continue antipyretics, IV fluids, supportive care.  No need for antibiotics at this time.  Follow.   9.  History of prostate cancer status post prostatectomy Receiving radiation treatment.  Per radiation oncology.  10.  Hyperlipidemia Patient noted to have elevated LFTs.  LFTs trending down.  Continue to hold statin.  Addendum Patient underwent upper endoscopy earlier on today 11/18/2018 and noted to have grade B esophagitis, gastritis with gastric erosions and still requiring inpatient treatment as patient has now been started on IV PPI twice daily for his  gastritis.  Patient started on a full liquid diet and needs better oral intake prior to discharge to avoid risk of recurrent dehydration and readmission.   DVT prophylaxis: Lovenox/SCDs Code Status: Full Family Communication: Updated patient.  No family at bedside.  Disposition Plan: Likely home when clinically improved, tolerating oral intake.    Consultants:  Gastroenterology: Dr. Michail Sermon 11/17/2018  Procedures:   Renal ultrasound 11/15/2018  Chest x-ray 11/15/2018  CT head 11/15/2018  Upper endoscopy pending  Antimicrobials:   None   Subjective: Patient sitting up in bed.  Has been n.p.o. since midnight.  Denies any nausea or emesis overnight.  Denies any shortness of breath.  No chest pain.  No abdominal pain.  No diarrhea.  States has been urinating almost every hour.  Objective: Vitals:   11/17/18 0602 11/17/18 1308 11/17/18 2134 11/18/18 0618  BP: 139/74 (!) 143/73 (!) 145/80 (!) 159/88  Pulse: 92 85 (!) 110 (!) 109  Resp: 15 16 18 16   Temp: 98.9 F (37.2 C) 97.8 F (36.6 C) 98.2 F (36.8 C) 98.4 F (36.9 C)  TempSrc: Oral Oral Oral Oral  SpO2: 96% 96% 97% 98%  Weight: 106.6 kg   107.2 kg  Height:        Intake/Output Summary (Last 24 hours)  at 11/18/2018 0852 Last data filed at 11/18/2018 9470 Gross per 24 hour  Intake 4964.67 ml  Output 4900 ml  Net 64.67 ml   Filed Weights   11/16/18 0852 11/17/18 0602 11/18/18 0618  Weight: 102.9 kg 106.6 kg 107.2 kg    Examination:  General exam: NAD Respiratory system: CTA B.  No wheezes, no crackles, no rhonchi.  Normal respiratory effort.  Speaking in full sentences.  Cardiovascular system: RRR no murmurs rubs or gallops.  No lower extremity edema.  No JVD.  Gastrointestinal system: Abdomen is soft, nontender, nondistended, positive bowel sounds.  No rebound.  No guarding.  Central nervous system: Alert and oriented. No focal neurological deficits. Extremities: Symmetric 5 x 5 power. Skin: No rashes,  lesions or ulcers Psychiatry: Judgement and insight appear normal. Mood & affect appropriate.     Data Reviewed: I have personally reviewed following labs and imaging studies  CBC: Recent Labs  Lab 11/15/18 1841 11/16/18 0451 11/17/18 0513 11/17/18 1417 11/18/18 0523  WBC 5.8 6.5 5.1  --  6.3  NEUTROABS 4.9  --  4.3  --   --   HGB 9.0* 8.6* 8.0* 8.1* 8.4*  HCT 30.1* 28.9* 26.7* 27.9* 27.9*  MCV 86.7 87.0 87.0  --  87.2  PLT 276 260 244  --  962   Basic Metabolic Panel: Recent Labs  Lab 11/15/18 1841 11/16/18 0451 11/17/18 0513 11/18/18 0523  NA 138 139 136 138  K 3.4* 3.8 3.6 3.7  CL 107 111 109 109  CO2 21* 21* 21* 21*  GLUCOSE 120* 129* 109* 108*  BUN 31* 30* 26* 23  CREATININE 2.43* 2.22* 2.00* 1.99*  CALCIUM 8.4* 8.3* 8.0* 8.1*  MG 1.8 2.4 1.9  --   PHOS 3.9  --   --   --    GFR: Estimated Creatinine Clearance: 48.4 mL/min (A) (by C-G formula based on SCr of 1.99 mg/dL (H)). Liver Function Tests: Recent Labs  Lab 11/15/18 1841 11/17/18 0513 11/18/18 0523  AST 75* 56* 44*  ALT 128* 102* 87*  ALKPHOS 387* 336* 321*  BILITOT 0.8 1.0 0.9  PROT 6.3* 5.7* 5.7*  ALBUMIN 2.3* 2.2* 2.0*   No results for input(s): LIPASE, AMYLASE in the last 168 hours. No results for input(s): AMMONIA in the last 168 hours. Coagulation Profile: No results for input(s): INR, PROTIME in the last 168 hours. Cardiac Enzymes: No results for input(s): CKTOTAL, CKMB, CKMBINDEX, TROPONINI in the last 168 hours. BNP (last 3 results) No results for input(s): PROBNP in the last 8760 hours. HbA1C: No results for input(s): HGBA1C in the last 72 hours. CBG: Recent Labs  Lab 11/16/18 0731 11/17/18 0732 11/18/18 0740  GLUCAP 110* 100* 98   Lipid Profile: No results for input(s): CHOL, HDL, LDLCALC, TRIG, CHOLHDL, LDLDIRECT in the last 72 hours. Thyroid Function Tests: Recent Labs    11/15/18 1841  TSH 1.224   Anemia Panel: Recent Labs    11/16/18 0451  VITAMINB12 198    FOLATE 6.6  FERRITIN 853*  TIBC 144*  IRON 18*   Sepsis Labs: No results for input(s): PROCALCITON, LATICACIDVEN in the last 168 hours.  Recent Results (from the past 240 hour(s))  Culture, blood (Routine X 2) w Reflex to ID Panel     Status: None (Preliminary result)   Collection Time: 11/15/18  6:32 PM  Result Value Ref Range Status   Specimen Description BLOOD LEFT ANTECUBITAL  Final   Special Requests   Final  BOTTLES DRAWN AEROBIC AND ANAEROBIC Blood Culture adequate volume Performed at [facility] [address] [place], [place] [address]    Culture NO GROWTH 2 DAYS  Final   Report Status PENDING  Incomplete  Culture, blood (Routine X 2) w Reflex to ID Panel     Status: None (Preliminary result)   Collection Time: 11/15/18  6:41 PM  Result Value Ref Range Status   Specimen Description BLOOD RIGHT HAND  Final   Special Requests   Final    BOTTLES DRAWN AEROBIC ONLY Blood Culture adequate volume Performed at [facility] [address], [place], [place] [address]    Culture NO GROWTH 2 DAYS  Final   Report Status PENDING  Incomplete  Urine culture     Status: None   Collection Time: 11/15/18  8:21 PM  Result Value Ref Range Status   Specimen Description   Final    URINE, RANDOM Performed at [facility], [place] [address], [place], [place] [address]    Special Requests   Final    NONE Performed at [facility], [place] [address], [place], [place] [address]    Culture   Final    NO GROWTH Performed at [facility], [place] [address] [place], [place] [address]    Report Status 11/17/2018 FINAL  Final         Radiology Studies: No results found.      Scheduled Meds: . aspirin  81 mg Oral Daily  . cyanocobalamin  1,000 mcg Intramuscular Daily  . enoxaparin (LOVENOX) injection  40 mg Subcutaneous Q24H  . multivitamin  1 tablet Oral BID  . sodium chloride flush  3 mL  Intravenous Q12H   Continuous Infusions: . sodium chloride 125 mL/hr at 11/18/18 0603     LOS: 3 days    Time spent: 35 mins    Irine Seal, MD [facility]  If 7PM-7AM, please contact night-coverage [email or web address] Password [facility] 11/18/2018, 8:52 AM

## 2018-11-19 ENCOUNTER — Ambulatory Visit: Payer: Managed Care, Other (non HMO)

## 2018-11-19 ENCOUNTER — Ambulatory Visit
Admission: RE | Admit: 2018-11-19 | Discharge: 2018-11-19 | Disposition: A | Discharge status: Home / Self Care | Payer: Managed Care, Other (non HMO) | Source: Ambulatory Visit | Attending: Radiation Oncology | Admitting: Radiation Oncology

## 2018-11-19 ENCOUNTER — Encounter (HOSPITAL_COMMUNITY): Payer: Self-pay | Admitting: Gastroenterology

## 2018-11-19 LAB — BASIC METABOLIC PANEL
Anion gap: 6 (ref 5–15)
BUN: 21 mg/dL (ref 8–23)
CALCIUM: 8.1 mg/dL — AB (ref 8.9–10.3)
CO2: 22 mmol/L (ref 22–32)
Chloride: 110 mmol/L (ref 98–111)
Creatinine, Ser: 2.07 mg/dL — ABNORMAL HIGH (ref 0.61–1.24)
GFR calc Af Amer: 37 mL/min — ABNORMAL LOW (ref >60)
GFR calc non Af Amer: 32 mL/min — ABNORMAL LOW (ref >60)
Glucose, Bld: 117 mg/dL — ABNORMAL HIGH (ref 70–99)
Potassium: 3.5 mmol/L (ref 3.5–5.1)
Sodium: 138 mmol/L (ref 135–145)

## 2018-11-19 LAB — CBC
HCT: 25.8 % — ABNORMAL LOW (ref 39.0–52.0)
Hemoglobin: 7.9 g/dL — ABNORMAL LOW (ref 13.0–17.0)
MCH: 26.5 pg (ref 26.0–34.0)
MCHC: 30.6 g/dL (ref 30.0–36.0)
MCV: 86.6 fL (ref 80.0–100.0)
NRBC: 0 % (ref 0.0–0.2)
Platelets: 229 10*3/uL (ref 150–400)
RBC: 2.98 MIL/uL — ABNORMAL LOW (ref 4.22–5.81)
RDW: 17.2 % — ABNORMAL HIGH (ref 11.5–15.5)
WBC: 5.9 10*3/uL (ref 4.0–10.5)

## 2018-11-19 LAB — GLUCOSE, CAPILLARY: Glucose-Capillary: 98 mg/dL (ref 70–99)

## 2018-11-19 MED ORDER — SODIUM CHLORIDE 0.9% FLUSH: 3.0000 mL | Freq: Two times a day (BID) | INTRAVENOUS | Status: DC

## 2018-11-19 MED ORDER — POTASSIUM CHLORIDE CRYS ER 20 MEQ PO TBCR
40.0000 meq | EXTENDED_RELEASE_TABLET | Freq: Once | ORAL | Status: AC
  Administered 2018-11-19: 40 meq via ORAL
  Filled 2018-11-19: qty 2

## 2018-11-19 MED ORDER — SODIUM CHLORIDE 0.9% FLUSH: 3.0000 mL | INTRAVENOUS | Status: DC | PRN

## 2018-11-19 MED ORDER — SODIUM CHLORIDE 0.9 % IV SOLN: 250.0000 mL | INTRAVENOUS | Status: DC | PRN

## 2018-11-19 NOTE — Progress Notes (Signed)
[facility] Gastroenterology Progress Note  Roy Joyce 68 y.o. 13-Nov-1950  CC: Nausea, vomiting, weight loss   Subjective: Feeling better.  Tolerating liquid diet.  Denies abdominal pain.  Denies blood in the stool.   Objective: Vital signs in last 24 hours: Vitals:   11/19/18 0524 11/19/18 1251  BP: (!) 141/81 127/74  Pulse: 89 97  Resp: 20 16  Temp: 98.2 F (36.8 C) 97.8 F (36.6 C)  SpO2: 95% 96%    Physical Exam:  General.  Alert/oriented x3.  Not in acute distress ABD :   Soft, nontender, nondistended, bowel sounds present.  Lab Results: Recent Labs    11/17/18 0513 11/18/18 0523 11/19/18 0458  NA 136 138 138  K 3.6 3.7 3.5  CL 109 109 110  CO2 21* 21* 22  GLUCOSE 109* 108* 117*  BUN 26* 23 21  CREATININE 2.00* 1.99* 2.07*  CALCIUM 8.0* 8.1* 8.1*  MG 1.9  --   --    Recent Labs    11/17/18 0513 11/18/18 0523  AST 56* 44*  ALT 102* 87*  ALKPHOS 336* 321*  BILITOT 1.0 0.9  PROT 5.7* 5.7*  ALBUMIN 2.2* 2.0*   Recent Labs    11/17/18 0513  11/18/18 0523 11/19/18 0458  WBC 5.1  --  6.3 5.9  NEUTROABS 4.3  --   --   --   HGB 8.0*   < > 8.4* 7.9*  HCT 26.7*   < > 27.9* 25.8*  MCV 87.0  --  87.2 86.6  PLT 244  --  251 229   < > = values in this interval not displayed.   No results for input(s): LABPROT, INR in the last 72 hours.    Assessment/Plan: -Nausea and vomiting.  Improving. -Esophagitis and gastritis based on EGD yesterday.  Biopsies negative for H. Pylori. -Weight loss. -Prostate cancer.  Currently undergoing radiation. -Abnormal LFTs.  Trending down.  Recommendations ------------------------ -Continue full liquid diet. -Continue IV twice daily PPI. - Need for colonoscopy for weight loss discussed with the patient.  Patient would like to have colonoscopy done once he is done with his radiation most likely as an outpatient. -GI will follow.  Otis Brace MD, [place] 11/19/2018, 1:16 PM  Contact #  [phone]

## 2018-11-19 NOTE — Progress Notes (Signed)
PROGRESS NOTE    Roy Joyce  JQG:[id number] DOB: Dec 27, 1950 DOA: 11/15/2018 PCP: Yevette Edwards, MD   Brief Narrative:  Patient is a 68 year old gentleman history of hyperlipidemia, prostate cancer status post prostatectomy 2016 undergoing radiation treatment to prostate fossa per Dr. Tammi Klippel, presented to radiation oncology clinic noted to have failure to thrive, 40 pound weight loss over the past month, orthostatic hypotension and tachycardia with intractable nausea and vomiting for the past 3 weeks with decreased oral intake.  Patient admitted and noted to be in acute renal failure, anemic.  Patient placed on IV fluids.   Assessment & Plan:   Principal Problem:   Intractable nausea and vomiting Active Problems:   Hyperlipidemia   Prostate cancer ([place])   H/O radical prostatectomy   Failure to thrive in adult   Orthostasis   Dehydration   Fever   ARF (acute renal failure) (HCC)   Low vitamin B12 level   Anemia  1 intractable nausea vomiting/failure to thrive Questionable etiology.  Patient does state has been having intractable nausea and vomiting daily over the past 3 weeks and unable to keep anything down prior to admission.  Patient with decreased appetite and decreased oral intake.  Patient with a 40 pound weight loss over the past month with some associated fevers and chills and diarrhea.  Patient with recent CT scan done on 11/14/2018 with no acute abnormalities explaining patient's symptoms.  CT showing prostatectomy, pelvic sidewall lymph nodes, retroperitoneal lymph nodes, splenomegaly, simple appearing hepatic cysts, single slightly irregular sclerotic lesion in the T12 vertebral body indeterminate, three-vessel coronary artery calcifications noted along with distal aortic and iliac artery calcifications.  Chest x-ray done negative for any acute abnormalities.  C. difficile PCR pending as patient did state he been having bouts of diarrhea over the past several days prior  to admission.  Patient with no diarrhea since admission.  Discontinued C. difficile PCR.  Discontinued contact precautions.  CT head negative.  Blood cultures pending with no growth to date.  Urinalysis unremarkable.  Patient tolerated clear liquids however stated had a bout of emesis the morning of 11/17/2018, after eating some cereal.  Patient also on presentation noted to have a 40 pound weight loss in addition to nausea vomiting and loose stools.  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  Patient status post upper endoscopy that showed esophagitis, gastritis with gastric erosions.  Patient placed on IV PPI every 12 hours.  Patient on a full liquid diet.  Saline lock IV fluids.  Continue antiemetics and supportive care.  GI following and appreciate input and recommendations.   2.  Acute renal failure On admission patient noted to have a creatinine of 2.43.  Last creatinine in the system was 1.13 on 02/18/2016.  Likely secondary to prerenal azotemia due to decreased oral intake, intractable nausea and vomiting.  Urinalysis nitrite negative with 30 of protein.  Fractional excretion of sodium was 0.61%.  Renal ultrasound negative for hydronephrosis.  Creatinine currently at 2.07 from 1.99 from 2.0 from 2.22.  Patient with a urine output of 4.010 L over the past 24 hours.  Saline lock IV fluids.  Patient on full liquid diet. Renal function likely slowly to equilibrate.  Discussed with nephrology.  Outpatient follow-up.  Follow.  3.  Transaminitis Questionable etiology.  Patient noted to have elevated LFTs.  Patient with nausea vomiting poor oral intake and  on a statin.  Patient with recent CT abdomen and pelvis done on 11/14/2018 which showed simple appearing hepatic cyst.  LFTs trending down.  Saline lock IV fluids. If no significant improvement or worsening, will check an acute  hepatitis panel.  Patient however did state has a history of hepatitis since childhood.  Continue to hold statin.  4.  Anemia/low vitamin B12 levels Patient with a hemoglobin of 9.0 on admission.  Last hemoglobin was 14.7 on 02/18/2016 per epic.  Hemoglobin this morning is 7.9. Likely dilutional component.  Patient denies any overt bleeding.  Anemia panel with a iron of 18, TIBC of 144, ferritin of 853, folate of 6.6.  Vitamin B12 of 198.  Status post IV Feraheme x1.  Vitamin B12 1000 MCG's IM daily x1 week, and then weekly x1 month, and then monthly.  Follow H&H.  Transfuse as needed. Patient also noted to have presented with GI symptoms of nausea vomiting diarrhea with a 40 pound weight loss over the past month.  Patient has been seen in consultation by gastroenterology and patient underwent upper endoscopy which showed grade B esophagitis, gastritis with gastric erosions.  Repeat H&H this afternoon.  Patient on IV PPI.  GI following.  5.  Hypokalemia Repleted.  6.  Dehydration Patient currently on a full liquid diet.  Will saline lock IV fluids and follow.   7.  Orthostasis Likely secondary to problem #1. Orthostasis improved with hydration.  Will saline lock IV fluids.  Patient currently on a full liquid diet.  Monitor closely.  8.  Fever Questionable etiology.  Patient has been pancultured cultures pending with no growth to date.  1 out of 4 blood cultures positive likely contaminant.  Patient afebrile during this hospitalization.  Patient noted to have loose stools per patient prior to admission however no loose stools during this admission.  C. difficile PCR has been discontinued.  Enteric precautions discontinued.  Urinalysis unremarkable. Continue antipyretics, IV fluids, supportive care.  DC IV Ancef and monitor off antibiotics.    9.  1/4 blood cultures positive Likely contaminant.  Patient afebrile.  Normal white count.  Discontinue IV Ancef and monitor off antibiotics.  10.   History of prostate cancer status post prostatectomy Receiving radiation treatment.  Per radiation oncology.  11.  Hyperlipidemia Patient noted to have elevated LFTs.  LFTs trending down.  Continue to hold statin.  Repeat LFTs tomorrow.    DVT prophylaxis: Lovenox/SCDs Code Status: Full Family Communication: Updated patient and wife at bedside. Disposition Plan: Likely home when clinically improved, tolerating oral intake.    Consultants:  Gastroenterology: Dr. Michail Sermon 11/17/2018  Procedures:   Renal ultrasound 11/15/2018  Chest x-ray 11/15/2018  CT head 11/15/2018  Upper endoscopy per Dr. Alessandra Bevels 11/18/2018--- grade B esophagitis, gastritis with gastric erosions.  Antimicrobials:   IV Ancef 11/18/2018>>>> 11/19/2018   Subjective: Patient sitting up in chair.  Stated had an episode of nausea last night after being given IV Ancef.  Tolerated full liquid diet.  Objective: Vitals:   11/18/18 1050 11/18/18 1234 11/18/18 2050 11/19/18 0524  BP: (!) 151/79 (!) 157/80 (!) 156/81 (!) 141/81  Pulse: 97 92 (!) 107 89  Resp: (!) 25 16 20 20   Temp:  98.1 F (36.7 C) 99.6 F (37.6 C) 98.2 F (36.8 C)  TempSrc:  Oral Oral Oral  SpO2: 98% 98% 98% 95%  Weight:    102.1 kg  Height:        Intake/Output Summary (Last 24 hours) at 11/19/2018 1131  Last data filed at 11/19/2018 0900 Gross per 24 hour  Intake 2172 ml  Output 3750 ml  Net -1578 ml   Filed Weights   11/18/18 0618 11/18/18 0946 11/19/18 0524  Weight: 107.2 kg 107.2 kg 102.1 kg    Examination:  General exam: NAD Respiratory system: Lungs clear to auscultation bilaterally.  No wheezes, no crackles, no rhonchi.  Speaking in full sentences.  Normal respiratory effort.  Cardiovascular system: Regular rate rhythm no murmurs rubs or gallops.  No lower extremity edema.  No JVD. Gastrointestinal system: Abdomen is nontender, nondistended, soft, positive bowel sounds.  No rebound.  No guarding.  Central nervous system:  Alert and oriented. No focal neurological deficits. Extremities: Symmetric 5 x 5 power. Skin: No rashes, lesions or ulcers Psychiatry: Judgement and insight appear normal. Mood & affect appropriate.     Data Reviewed: I have personally reviewed following labs and imaging studies  CBC: Recent Labs  Lab 11/15/18 1841 11/16/18 0451 11/17/18 0513 11/17/18 1417 11/18/18 0523 11/19/18 0458  WBC 5.8 6.5 5.1  --  6.3 5.9  NEUTROABS 4.9  --  4.3  --   --   --   HGB 9.0* 8.6* 8.0* 8.1* 8.4* 7.9*  HCT 30.1* 28.9* 26.7* 27.9* 27.9* 25.8*  MCV 86.7 87.0 87.0  --  87.2 86.6  PLT 276 260 244  --  251 709   Basic Metabolic Panel: Recent Labs  Lab 11/15/18 1841 11/16/18 0451 11/17/18 0513 11/18/18 0523 11/19/18 0458  NA 138 139 136 138 138  K 3.4* 3.8 3.6 3.7 3.5  CL 107 111 109 109 110  CO2 21* 21* 21* 21* 22  GLUCOSE 120* 129* 109* 108* 117*  BUN 31* 30* 26* 23 21  CREATININE 2.43* 2.22* 2.00* 1.99* 2.07*  CALCIUM 8.4* 8.3* 8.0* 8.1* 8.1*  MG 1.8 2.4 1.9  --   --   PHOS 3.9  --   --   --   --    GFR: Estimated Creatinine Clearance: 42.5 mL/min (A) (by C-G formula based on SCr of 2.07 mg/dL (H)). Liver Function Tests: Recent Labs  Lab 11/15/18 1841 11/17/18 0513 11/18/18 0523  AST 75* 56* 44*  ALT 128* 102* 87*  ALKPHOS 387* 336* 321*  BILITOT 0.8 1.0 0.9  PROT 6.3* 5.7* 5.7*  ALBUMIN 2.3* 2.2* 2.0*   No results for input(s): LIPASE, AMYLASE in the last 168 hours. No results for input(s): AMMONIA in the last 168 hours. Coagulation Profile: No results for input(s): INR, PROTIME in the last 168 hours. Cardiac Enzymes: No results for input(s): CKTOTAL, CKMB, CKMBINDEX, TROPONINI in the last 168 hours. BNP (last 3 results) No results for input(s): PROBNP in the last 8760 hours. HbA1C: No results for input(s): HGBA1C in the last 72 hours. CBG: Recent Labs  Lab 11/16/18 0731 11/17/18 0732 11/18/18 0740 11/19/18 0743  GLUCAP 110* 100* 98 98   Lipid Profile: No  results for input(s): CHOL, HDL, LDLCALC, TRIG, CHOLHDL, LDLDIRECT in the last 72 hours. Thyroid Function Tests: No results for input(s): TSH, T4TOTAL, FREET4, T3FREE, THYROIDAB in the last 72 hours. Anemia Panel: No results for input(s): VITAMINB12, FOLATE, FERRITIN, TIBC, IRON, RETICCTPCT in the last 72 hours. Sepsis Labs: No results for input(s): PROCALCITON, LATICACIDVEN in the last 168 hours.  Recent Results (from the past 240 hour(s))  Culture, blood (Routine X 2) w Reflex to ID Panel     Status: None (Preliminary result)   Collection Time: 11/15/18  6:32 PM  Result Value  Ref Range Status   Specimen Description   Final    BLOOD LEFT ANTECUBITAL Performed at [facility] [address], [place], [place] [address]    Special Requests   Final    BOTTLES DRAWN AEROBIC AND ANAEROBIC Blood Culture adequate volume Performed at [facility] [address], [place], [place] [address]    Culture   Final    NO GROWTH 3 DAYS Performed at [facility], [place] [address] [place], [place] [address]    Report Status PENDING  Incomplete  Culture, blood (Routine X 2) w Reflex to ID Panel     Status: None (Preliminary result)   Collection Time: 11/15/18  6:41 PM  Result Value Ref Range Status   Specimen Description   Final    BLOOD RIGHT HAND Performed at [facility] [address], [place], [place] [address]    Special Requests   Final    BOTTLES DRAWN AEROBIC ONLY Blood Culture adequate volume Performed at [facility] [address] [place], [place] [address]    Culture  Setup Time   Final    AEROBIC BOTTLE ONLY GRAM POSITIVE RODS CRITICAL RESULT CALLED TO, READ BACK BY AND VERIFIED WITH: Melodye Ped [facility] 11/18/18 2107 JDW Performed at [facility], [place] [address] [place], [place] [address]    Culture GRAM POSITIVE RODS  Final   Report Status PENDING  Incomplete  Urine culture     Status: None     Collection Time: 11/15/18  8:21 PM  Result Value Ref Range Status   Specimen Description   Final    URINE, RANDOM Performed at [facility], [place] [address] [place], [place] [address]    Special Requests   Final    NONE Performed at [facility], [place] [address], [place], [place] [address]    Culture   Final    NO GROWTH Performed at [facility], [place] [address], [place],  [address]    Report Status 11/17/2018 FINAL  Final         Radiology Studies: No results found.      Scheduled Meds: . aspirin  81 mg Oral Daily  . cyanocobalamin  1,000 mcg Intramuscular Daily  . enoxaparin (LOVENOX) injection  40 mg Subcutaneous Q24H  . multivitamin  1 tablet Oral BID  . pantoprazole (PROTONIX) IV  40 mg Intravenous Q12H   Continuous Infusions: .  ceFAZolin (ANCEF) IV 2 g (11/19/18 0354)     LOS: 4 days    Time spent: 35 mins    Irine Seal, MD [facility]  If 7PM-7AM, please contact night-coverage [email or web address] Password [facility] 11/19/2018, 11:31 AM

## 2018-11-20 ENCOUNTER — Ambulatory Visit
Admission: RE | Admit: 2018-11-20 | Discharge: 2018-11-20 | Disposition: A | Discharge status: Home / Self Care | Payer: Managed Care, Other (non HMO) | Source: Ambulatory Visit | Attending: Radiation Oncology | Admitting: Radiation Oncology

## 2018-11-20 DIAGNOSIS — E860 Dehydration: Secondary | ICD-10-CM

## 2018-11-20 DIAGNOSIS — R112 Nausea with vomiting, unspecified: Secondary | ICD-10-CM

## 2018-11-20 DIAGNOSIS — N179 Acute kidney failure, unspecified: Secondary | ICD-10-CM

## 2018-11-20 DIAGNOSIS — D519 Vitamin B12 deficiency anemia, unspecified: Secondary | ICD-10-CM

## 2018-11-20 LAB — CBC
HEMATOCRIT: 26.5 % — AB (ref 39.0–52.0)
Hemoglobin: 8 g/dL — ABNORMAL LOW (ref 13.0–17.0)
MCH: 26.3 pg (ref 26.0–34.0)
MCHC: 30.2 g/dL (ref 30.0–36.0)
MCV: 87.2 fL (ref 80.0–100.0)
Platelets: 239 10*3/uL (ref 150–400)
RBC: 3.04 MIL/uL — ABNORMAL LOW (ref 4.22–5.81)
RDW: 17.2 % — ABNORMAL HIGH (ref 11.5–15.5)
WBC: 5.6 10*3/uL (ref 4.0–10.5)
nRBC: 0 % (ref 0.0–0.2)

## 2018-11-20 LAB — COMPREHENSIVE METABOLIC PANEL
ALT: 66 U/L — ABNORMAL HIGH (ref 0–44)
AST: 51 U/L — ABNORMAL HIGH (ref 15–41)
Albumin: 2 g/dL — ABNORMAL LOW (ref 3.5–5.0)
Alkaline Phosphatase: 292 U/L — ABNORMAL HIGH (ref 38–126)
Anion gap: 8 (ref 5–15)
BUN: 21 mg/dL (ref 8–23)
CO2: 20 mmol/L — AB (ref 22–32)
Calcium: 7.9 mg/dL — ABNORMAL LOW (ref 8.9–10.3)
Chloride: 105 mmol/L (ref 98–111)
Creatinine, Ser: 2.23 mg/dL — ABNORMAL HIGH (ref 0.61–1.24)
GFR calc non Af Amer: 29 mL/min — ABNORMAL LOW (ref >60)
GFR, EST AFRICAN AMERICAN: 34 mL/min — AB (ref >60)
Glucose, Bld: 157 mg/dL — ABNORMAL HIGH (ref 70–99)
Potassium: 3.4 mmol/L — ABNORMAL LOW (ref 3.5–5.1)
Sodium: 133 mmol/L — ABNORMAL LOW (ref 135–145)
Total Bilirubin: 0.7 mg/dL (ref 0.3–1.2)
Total Protein: 5.3 g/dL — ABNORMAL LOW (ref 6.5–8.1)

## 2018-11-20 LAB — CULTURE, BLOOD (ROUTINE X 2)
Culture: NO GROWTH
Special Requests: ADEQUATE

## 2018-11-20 LAB — GLUCOSE, CAPILLARY: Glucose-Capillary: 105 mg/dL — ABNORMAL HIGH (ref 70–99)

## 2018-11-20 LAB — VITAMIN B12: Vitamin B-12: >7500 pg/mL — ABNORMAL HIGH (ref 180–914)

## 2018-11-20 LAB — MAGNESIUM: Magnesium: 1.5 mg/dL — ABNORMAL LOW (ref 1.7–2.4)

## 2018-11-20 MED ORDER — VITAMIN B-12 1000 MCG PO TABS: 1000.0000 ug | ORAL_TABLET | Freq: Every day | ORAL | 3 refills | Status: AC

## 2018-11-20 MED ORDER — ONDANSETRON 4 MG PO TBDP: 4.0000 mg | ORAL_TABLET | Freq: Three times a day (TID) | ORAL | 0 refills | Status: AC | PRN

## 2018-11-20 MED ORDER — PANTOPRAZOLE SODIUM 40 MG PO TBEC: 40.0000 mg | DELAYED_RELEASE_TABLET | Freq: Two times a day (BID) | ORAL | 2 refills | Status: AC

## 2018-11-20 MED ORDER — PANTOPRAZOLE SODIUM 40 MG PO TBEC
40.0000 mg | DELAYED_RELEASE_TABLET | Freq: Two times a day (BID) | ORAL | Status: DC
  Administered 2018-11-20: 40 mg via ORAL
  Filled 2018-11-20: qty 1

## 2018-11-20 MED ORDER — MAGNESIUM SULFATE 50 % IJ SOLN: 4.0000 g | Freq: Once | INTRAVENOUS | Status: DC

## 2018-11-20 MED ORDER — MAGNESIUM SULFATE 4 GM/100ML IV SOLN
4.0000 g | Freq: Once | INTRAVENOUS | Status: AC
  Administered 2018-11-20: 4 g via INTRAVENOUS
  Filled 2018-11-20: qty 100

## 2018-11-20 MED ORDER — ONDANSETRON 4 MG PO TBDP: 4.0000 mg | ORAL_TABLET | Freq: Three times a day (TID) | ORAL | 0 refills | Status: DC | PRN

## 2018-11-20 MED ORDER — POTASSIUM CHLORIDE CRYS ER 20 MEQ PO TBCR
40.0000 meq | EXTENDED_RELEASE_TABLET | Freq: Once | ORAL | Status: AC
  Administered 2018-11-20: 40 meq via ORAL
  Filled 2018-11-20: qty 2

## 2018-11-20 MED ORDER — PANTOPRAZOLE SODIUM 40 MG PO TBEC: 40.0000 mg | DELAYED_RELEASE_TABLET | Freq: Two times a day (BID) | ORAL | 2 refills | Status: DC

## 2018-11-20 MED ORDER — VITAMIN B-12 1000 MCG PO TABS: 1000.0000 ug | ORAL_TABLET | Freq: Every day | ORAL | 3 refills | Status: DC

## 2018-11-20 NOTE — Evaluation (Signed)
Physical Therapy Evaluation Patient Details Name: Roy Joyce MRN: [id number] DOB: 24-Oct-1950 Today's Date: 11/20/2018   History of Present Illness  Pt admitted from Radiation Oncologist's office 2* N/V, dehydration, and orthostatic hypotension.  Pt with hx of Prostate CA  Clinical Impression  Patient is ambulating without assistance. Encouraged ambulation with family. PT signing off.    Follow Up Recommendations No PT follow up    Equipment Recommendations  None recommended by PT    Recommendations for Other Services       Precautions / Restrictions Precautions Precautions: Fall      Mobility  Bed Mobility                  Transfers                 General transfer comment: Pt unassisted sit<>stand  Ambulation/Gait Ambulation/Gait assistance: Supervision Gait Distance (Feet): 250 Feet   Gait Pattern/deviations: Step-through pattern Gait velocity: mod pace   General Gait Details: no instability, no LOB  Stairs            Wheelchair Mobility    Modified Rankin (Stroke Patients Only)       Balance Overall balance assessment: Independent                                           Pertinent Vitals/Pain Pain Assessment: No/denies pain    Home Living Family/patient expects to be discharged to:: Private residence Living Arrangements: Spouse/significant other Available Help at Discharge: Family Type of Home: House Home Access: Level entry     Home Layout: One level Home Equipment: None      Prior Function                 Hand Dominance        Extremity/Trunk Assessment        Lower Extremity Assessment Lower Extremity Assessment: Overall WFL for tasks assessed    Cervical / Trunk Assessment Cervical / Trunk Assessment: Normal  Communication      Cognition   Behavior During Therapy: WFL for tasks assessed/performed Overall Cognitive Status: Within Functional Limits for tasks assessed                                         General Comments      Exercises     Assessment/Plan    PT Assessment Patent does not need any further PT services  PT Problem List         PT Treatment Interventions      PT Goals (Current goals can be found in the Care Plan section)  Acute Rehab PT Goals Patient Stated Goal: go home PT Goal Formulation: All assessment and education complete, DC therapy    Frequency     Barriers to discharge        Co-evaluation               AM-PAC PT "6 Clicks" Mobility  Outcome Measure Help needed turning from your back to your side while in a flat bed without using bedrails?: None Help needed moving from lying on your back to sitting on the side of a flat bed without using bedrails?: None Help needed moving to and from a bed to a chair (including a wheelchair)?: None  Help needed standing up from a chair using your arms (e.g., wheelchair or bedside chair)?: None Help needed to walk in hospital room?: None Help needed climbing 3-5 steps with a railing? : None 6 Click Score: 24    End of Session   Activity Tolerance: Patient tolerated treatment well;Patient limited by fatigue Patient left: with family/visitor present Nurse Communication: Mobility status      Time: 0981-1914 PT Time Calculation (min) (ACUTE ONLY): 10 min   Charges:   PT Evaluation $PT Re-evaluation: 1 Re-eval          Tresa Endo PT Acute Rehabilitation Services Pager [phone] Office [phone]   Claretha Cooper 11/20/2018, 11:45 AM

## 2018-11-20 NOTE — Progress Notes (Signed)
[facility] Radiation Oncology Dept Therapy Treatment Record Phone [phone]   Radiation Therapy was administered to Roy Joyce on: 11/20/2018  1:16 PM and was treatment #38  out of a planned course of 38 treatments.  Radiation Treatment  1). Beam photons with 6-10 energy and Photons 6-10 MeV  2). Brachytherapy None  3). Stereotactic Radiosurgery None  4). Other Radiation None     Roy Joyce, Roy Joyce, RT (T)

## 2018-11-20 NOTE — Progress Notes (Addendum)
[facility] Gastroenterology Progress Note  Roy Joyce 68 y.o. 06/30/1951  CC: Nausea, vomiting, weight loss   Subjective: Continues to do better.  Tolerating full liquid diet. He is willing to advance his diet to soft.  Denies abdominal pain, nausea, vomiting.  Denies any blood in the stool.   Objective: Vital signs in last 24 hours: Vitals:   11/19/18 2212 11/20/18 0611  BP: (!) 143/73 134/81  Pulse: 99 98  Resp: 17 18  Temp: 99 F (37.2 C) 97.9 F (36.6 C)  SpO2: 96% 96%    Physical Exam:  General.  Alert/oriented x3.  Not in acute distress ABD :   Soft, nontender, nondistended, bowel sounds present.  Lab Results: Recent Labs    11/19/18 0458 11/20/18 0350  NA 138 133*  K 3.5 3.4*  CL 110 105  CO2 22 20*  GLUCOSE 117* 157*  BUN 21 21  CREATININE 2.07* 2.23*  CALCIUM 8.1* 7.9*  MG  --  1.5*   Recent Labs    11/18/18 0523 11/20/18 0350  AST 44* 51*  ALT 87* 66*  ALKPHOS 321* 292*  BILITOT 0.9 0.7  PROT 5.7* 5.3*  ALBUMIN 2.0* 2.0*   Recent Labs    11/19/18 0458 11/20/18 0350  WBC 5.9 5.6  HGB 7.9* 8.0*  HCT 25.8* 26.5*  MCV 86.6 87.2  PLT 229 239   No results for input(s): LABPROT, INR in the last 72 hours.    Assessment/Plan: -Nausea and vomiting.  Resolved -Esophagitis and gastritis based on EGD 11/18/2018 -  Biopsies negative for H. Pylori. -Weight loss. -Prostate cancer.  Currently undergoing radiation. -Abnormal LFTs.  Trending down. -Anemia.  Unspecified.  Hemoglobin stable.  Recommendations ------------------------ -Advance diet to soft.  Biopsies negative for H. pylori which was discussed with the patient. -Continue IV twice daily PPI. -Check hepatitis panel. - Need for colonoscopy for weight loss discussed with the patient.  Patient would like to have colonoscopy done once he is done with his radiation  -GI will follow.  Otis Brace MD, [place] 11/20/2018, 8:58 AM  Contact #  [phone]

## 2018-11-20 NOTE — Discharge Summary (Addendum)
Physician Discharge Summary   Patient ID: Roy Joyce MRN: [id number] DOB/AGE: 68-Dec-1952 68 y.o.  Admit date: 11/15/2018 Discharge date: 11/20/2018  Primary Care Physician:  Yevette Edwards, MD   Recommendations for Outpatient Follow-up:  1. Follow up with PCP in 1-2 weeks 2. Please follow CBC, LFTS, renal panel in 1 week  Home Health: None  Equipment/Devices:   Discharge Condition: stable CODE STATUS: FULL  Diet recommendation: Full liquid or soft solids as tolerated   Discharge Diagnoses:    . Failure to thrive in adult . Orthostasis . Dehydration . Hyperlipidemia . Prostate cancer [facility]) status post prostatectomy . Intractable nausea and vomiting . Acute kidney injury . Low vitamin B12 level . Anemia normocytic, B12 deficiency Transaminitis   Consults: GI    Allergies:  No Known Allergies   DISCHARGE MEDICATIONS: Allergies as of 11/20/2018   No Known Allergies     Medication List    STOP taking these medications   acetaminophen 500 MG tablet Commonly known as:  TYLENOL   rosuvastatin 10 MG tablet Commonly known as:  CRESTOR     TAKE these medications   ASPIRIN 81 PO Take 81 mg by mouth daily.   Fish Oil 1000 MG Caps Take 1,000 mg by mouth daily.   ondansetron 4 MG disintegrating tablet Commonly known as:  ZOFRAN ODT Take 1 tablet (4 mg total) by mouth every 8 (eight) hours as needed for nausea or vomiting.   OSTEO BI-FLEX JOINT SHIELD PO Take by mouth.   pantoprazole 40 MG tablet Commonly known as:  PROTONIX Take 1 tablet (40 mg total) by mouth 2 (two) times daily.   PRESERVISION AREDS 2+MULTI VIT PO Take 1 tablet by mouth 2 (two) times daily.   vitamin B-12 1000 MCG tablet Commonly known as:  CYANOCOBALAMIN Take 1 tablet (1,000 mcg total) by mouth daily.   Vitamin D-3 25 MCG (1000 UT) Caps Take 1,000 Units by mouth daily.        Brief H and P: For complete details please refer to admission H and P, but in brief Patient  is a 68 year old gentleman history of hyperlipidemia, prostate cancer status post prostatectomy 2016 undergoing radiation treatment to prostate fossa per Dr. Tammi Klippel, presented to radiation oncology clinic noted to have failure to thrive, 40 pound weight loss over the past month, orthostatic hypotension and tachycardia with intractable nausea and vomiting for the past 3 weeks with decreased oral intake.  Patient admitted and noted to be in acute renal failure, anemic.  Patient placed on IV fluids.   Hospital Course:     Intractable nausea and vomiting likely due to esophagitis and gastritis -Improved, tolerating soft diet -Patient was having intractable nausea and vomiting over the past couple weeks, decreased appetite, associated fever chills and diarrhea, weight loss -Recent CT on 2/20 had shown no acute abnormalities explaining patient's symptoms, short prostatectomy, pelvic sidewall lymph nodes, retroperitoneal lymph nodes, splenomegaly, simple appearing hepatic cysts, single slightly irregular sclerotic lesion on T12, three-vessel coronary artery calcifications. -Diarrhea resolved, blood cultures negative -Underwent endoscopy which showed esophagitis, gastritis with gastric erosions, patient was placed on PPI, antiemetics -Follow outpatient with GI in 2 weeks  Acute kidney injury -Likely due to poor oral intake, nausea and vomiting -Renal ultrasound negative for hydronephrosis -Now tolerating diet, outpatient follow-up with nephrology if creatinine not improving -Creatinine currently 2.2, creatinine was 2.4 at the time of admission  Transaminitis - Unclear etiology, hold statin, Tylenol -Outpatient follow-up with GI, follow hepatitis panel  Hypokalemia  Replaced   Dehydration with orthostasis -Likely due to #1, currently tolerating solid diet, IV fluids discontinued  Fever, 1/4 blood cultures positive, likely contaminant Patient was placed on IV Ancef, discontinued, C. difficile  has been discontinued, UA unremarkable   History of prostate cancer status post prostatectomy Per radiation oncology and urology Last radiation cycle today, patient will DC home after the radiation therapy  Generalized debility Did very well with PT, no PT follow-up needed  B12 deficiency B12 198, borderline low, patient received IM B12 shots while inpatient, placed on oral B12 supplementation at discharge.   Day of Discharge S: Tolerating soft solids  BP (!) 166/83 (BP Location: Left Arm)   Pulse 99   Temp 98.3 F (36.8 C) (Oral)   Resp 20   Ht 6\' 4"  (1.93 m)   Wt 103.7 kg   SpO2 98%   BMI 27.83 kg/m   Physical Exam: General: Alert and awake oriented x3 not in any acute distress. HEENT: anicteric sclera, pupils reactive to light and accommodation CVS: S1-S2 clear no murmur rubs or gallops Chest: clear to auscultation bilaterally, no wheezing rales or rhonchi Abdomen: soft nontender, nondistended, normal bowel sounds Extremities: no cyanosis, clubbing or edema noted bilaterally Neuro: Cranial nerves II-XII intact, no focal neurological deficits   The results of significant diagnostics from this hospitalization (including imaging, microbiology, ancillary and laboratory) are listed below for reference.      Procedures/Studies:  Ct Abdomen Pelvis Wo Contrast  Result Date: 11/14/2018 CLINICAL DATA:  History of prostate cancer. 40 pound weight loss and decreased appetite. EXAM: CT ABDOMEN AND PELVIS WITHOUT CONTRAST TECHNIQUE: Multidetector CT imaging of the abdomen and pelvis was performed following the standard protocol without IV contrast. COMPARISON:  None. FINDINGS: Lower chest: The lung bases are clear of an acute process. I do not see any worrisome pulmonary nodules to suggest pulmonary metastatic disease. Moderate pectus deformity noted with mass effect on the right ventricle. There are age advanced three-vessel coronary artery calcifications noted. Hepatobiliary:  Small scattered hepatic cysts but no worrisome hepatic lesions or intrahepatic biliary dilatation. Layering gallstones or sludge noted in the gallbladder. Normal caliber common bile duct. Pancreas: No mass, inflammation or ductal dilatation without contrast. Spleen: Splenomegaly is noted. The spleen measures 15.5 x 12.2 x 12.3 cm. No focal lesions. Adrenals/Urinary Tract: The adrenal glands and kidneys are unremarkable. No worrisome renal lesions without contrast. No renal calculi or obstructing ureteral calculi. Moderate bladder wall thickening likely due to lack of distension. No bladder mass. Stomach/Bowel: The stomach, duodenum, small bowel and colon are grossly normal without oral contrast. No obvious acute inflammatory process, mass lesions or obstructive findings. Mild to moderate rectal wall thickening likely radiation related. Vascular/Lymphatic: Advanced atherosclerotic calcifications involving the distal aorta iliac arteries but no aneurysm. Hazy interstitial changes in the small bowel mesentery with a few scattered lymph nodes is likely due to panniculitis or mesenteritis. No mesenteric or retroperitoneal mass. There are few small scattered retroperitoneal lymph nodes which are indeterminate. Comparison with any prior studies would be helpful if available. 9 mm left-sided retroperitoneal lymph node on image number 47. 8 mm right-sided retroperitoneal lymph node on image number 53. 8 mm left para-aortic lymph node on image number 52. Indeterminate 11 mm left pelvic sidewall lymph node on image number 86. Reproductive: Status post prostatectomy. Other: No free pelvic fluid collections or inguinal mass or adenopathy. Musculoskeletal: There is a sclerotic lesion in the T12 vertebral body which is indeterminate. I do not see any  other sclerotic lesions to suggest this is metastatic disease but comparison with any prior foot studies would be helpful. IMPRESSION: 1. Status post prostatectomy. Indeterminate 11  mm left pelvic sidewall lymph node and a few borderline retroperitoneal lymph nodes as detailed above. Comparison with any prior films would be helpful. 2. Splenomegaly. 3. Simple appearing hepatic cyst. 4. Three-vessel coronary artery calcifications are noted along with distal aortic and iliac artery calcifications. 5. Single slightly irregular sclerotic lesion in the T12 vertebral body is indeterminate. Comparison with any prior studies would be helpful. Electronically Signed   By: Marijo Sanes M.D.   On: 11/14/2018 16:38   X-ray Chest Pa And Lateral  Result Date: 11/15/2018 CLINICAL DATA:  Fever EXAM: CHEST - 2 VIEW COMPARISON:  None. FINDINGS: No focal airspace disease or effusion. Normal heart size. Aortic atherosclerosis. No pneumothorax. IMPRESSION: No active cardiopulmonary disease. Electronically Signed   By: Donavan Foil M.D.   On: 11/15/2018 21:50   Ct Head Wo Contrast  Result Date: 11/15/2018 CLINICAL DATA:  Nausea and vomiting.  History of prostate cancer. EXAM: CT HEAD WITHOUT CONTRAST TECHNIQUE: Contiguous axial images were obtained from the base of the skull through the vertex without intravenous contrast. COMPARISON:  None. FINDINGS: BRAIN: No intraparenchymal hemorrhage, mass effect nor midline shift. No parenchymal brain volume loss for age. No hydrocephalus. No acute large vascular territory infarcts. No abnormal extra-axial fluid collections. Basal cisterns are patent. VASCULAR: Mild calcific atherosclerosis of the carotid siphons. SKULL: No skull fracture. Nonspecific subcentimeter sclerotic focus LEFT clivus. No significant scalp soft tissue swelling. SINUSES/ORBITS: Trace paranasal sinus mucosal thickening. Mastoid air cells are well aerated.The included ocular globes and orbital contents are non-suspicious. OTHER: None. IMPRESSION: 1. Negative noncontrast CT HEAD for age. 2. Clival bone island, less likely prostatic metastasis. Electronically Signed   By: Elon Alas M.D.    On: 11/15/2018 21:19   US Renal  Result Date: 11/15/2018 CLINICAL DATA:  Acute renal failure EXAM: RENAL / URINARY TRACT ULTRASOUND COMPLETE COMPARISON:  CT 11/14/2018 FINDINGS: Right Kidney: Renal measurements: 11.4 x 6.6 x 6.7 cm = volume: 265 mL . Echogenicity within normal limits. No mass or hydronephrosis visualized. Left Kidney: Renal measurements: 12 x 5.7 x 6 cm = volume: 217 mL. Echogenicity within normal limits. No mass or hydronephrosis visualized. Bladder: Appears normal for degree of bladder distention. IMPRESSION: Negative renal ultrasound Electronically Signed   By: Donavan Foil M.D.   On: 11/15/2018 21:54      LAB RESULTS: Basic Metabolic Panel: Recent Labs  Lab 11/15/18 1841  11/19/18 0458 11/20/18 0350  NA 138   < > 138 133*  K 3.4*   < > 3.5 3.4*  CL 107   < > 110 105  CO2 21*   < > 22 20*  GLUCOSE 120*   < > 117* 157*  BUN 31*   < > 21 21  CREATININE 2.43*   < > 2.07* 2.23*  CALCIUM 8.4*   < > 8.1* 7.9*  MG 1.8   < >  --  1.5*  PHOS 3.9  --   --   --    < > = values in this interval not displayed.   Liver Function Tests: Recent Labs  Lab 11/18/18 0523 11/20/18 0350  AST 44* 51*  ALT 87* 66*  ALKPHOS 321* 292*  BILITOT 0.9 0.7  PROT 5.7* 5.3*  ALBUMIN 2.0* 2.0*   No results for input(s): LIPASE, AMYLASE in the last 168 hours. No results  for input(s): AMMONIA in the last 168 hours. CBC: Recent Labs  Lab 11/17/18 0513  11/19/18 0458 11/20/18 0350  WBC 5.1   < > 5.9 5.6  NEUTROABS 4.3  --   --   --   HGB 8.0*   < > 7.9* 8.0*  HCT 26.7*   < > 25.8* 26.5*  MCV 87.0   < > 86.6 87.2  PLT 244   < > 229 239   < > = values in this interval not displayed.   Cardiac Enzymes: No results for input(s): CKTOTAL, CKMB, CKMBINDEX, TROPONINI in the last 168 hours. BNP: Invalid input(s): POCBNP CBG: Recent Labs  Lab 11/19/18 0743 11/20/18 0719  GLUCAP 98 105*      Disposition and Follow-up: Discharge Instructions    Discharge instructions    Complete by:  As directed    Diet: Full liquids or soft solids, please drink Ensure or boost daily.   Increase activity slowly   Complete by:  As directed        DISPOSITION: Home   DISCHARGE FOLLOW-UP Follow-up Information    Yevette Edwards, MD. Schedule an appointment as soon as possible for a visit in 2 week(s).   Specialty:  Internal Medicine Contact information: [place] [address] [phone]        Otis Brace, MD. Schedule an appointment as soon as possible for a visit in 2 week(s).   Specialty:  Gastroenterology Contact information: [address] [place] [address] [phone]            Time coordinating discharge:  32mins   Signed:   Estill Cotta M.D. [facility] 11/20/2018, 2:36 PM

## 2018-11-20 NOTE — Progress Notes (Signed)
Pt discharged home with spouse in stable condition. Discharge instructions and scripts given. Pt verbalized understanding. No immediate questions or concerns at this time. Pt discharged from unit via wheelchair.  

## 2018-11-21 LAB — HEPATITIS B SURFACE ANTIGEN: Hepatitis B Surface Ag: NEGATIVE

## 2018-11-21 LAB — HEPATITIS C ANTIBODY (REFLEX): HCV Ab: 0.1 s/co ratio (ref 0.0–0.9)

## 2018-11-21 LAB — HCV COMMENT:

## 2018-11-22 ENCOUNTER — Encounter: Payer: Self-pay | Admitting: Radiation Oncology

## 2018-11-22 LAB — CULTURE, BLOOD (ROUTINE X 2): Special Requests: ADEQUATE

## 2018-11-22 NOTE — Progress Notes (Signed)
  Radiation Oncology         [phone] ________________________________  Name: Roy Joyce MRN: [id number]  Date: 11/22/2018  DOB: 07-24-1951  End of Treatment Note  Diagnosis:   68 y.o. 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     Indication for treatment:  Curative, Definitive Radiotherapy       Radiation treatment dates:   09/26/2018 - 11/20/2018  Site/dose:  1. The prostate fossa and pelvic lymph nodes were initially treated to 45 Gy in 25 fractions of 1.8 Gy  2. The prostate fossa only was boosted to 68.4 Gy with 13 additional fractions of 1.8 Gy   Beams/energy:  1. The prostate fossa  and pelvic lymph nodes were initially treated using VMAT intensity modulated radiotherapy delivering 6 megavolt photons. Image guidance was performed with CB-CT studies prior to each fraction. He was immobilized with a body fix lower extremity mold.  2. The prostate fossa only was boosted using VMAT intensity modulated radiotherapy delivering 6 megavolt photons. Image guidance was performed with CB-CT studies prior to each fraction. He was immobilized with a body fix lower extremity mold.  Narrative: The patient tolerated radiation treatment relatively well. He denied dysuria or hematuria, difficulty emptying his bladder, and issues with his urine stream throughout treatment.   In the 5th week of treatment, he developed increasing diarrhea, decreased appetite, unintended weight loss, severe night sweats, nausea and vomiting, right-sided headaches, shortness of breath, dizziness, and fevers. He was noted to have a 40 lb weight loss since beginning treatment as well as orthostatic hypotension despite administration of 1L fluids daily for a week.  Therefore, he was admitted to the hospital on 11/15/2018 due to hypovolemia and failure to thrive but was able to complete his final  treatments as scheduled.  Plan: The patient has completed radiation treatment. He will return to radiation oncology clinic for routine followup in one month. I advised him to call or return sooner if he has any questions or concerns related to his recovery or treatment. ________________________________  Sheral Apley. Tammi Klippel, M.D.   This document serves as a record of services personally performed by Tyler Pita, MD. It was created on his behalf by Wilburn Mylar, a trained medical scribe. The creation of this record is based on the scribe's personal observations and the provider's statements to them. This document has been checked and approved by the attending provider.

## 2018-11-25 ENCOUNTER — Telehealth: Payer: Self-pay | Admitting: Radiation Oncology
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
## 2018-12-04 ENCOUNTER — Telehealth: Payer: Self-pay | Admitting: Radiation Oncology

## 2018-12-04 NOTE — Telephone Encounter (Signed)
-----   Message from Tyler Pita, MD sent at 11/25/2018  5:44 PM EST ----- Dr. Rosana Berger, After I documented this note, I ordered a CRP and Sed Rate.  Both were markedly elevated.  I'll have my office fax those results to you.  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  I obtained a CT without contrast prior to his admission, and I think there may be mesentery abnormalities, but, without contrast, they can't be distinguished from normal loops of bowel. Ledon Snare

## 2018-12-04 NOTE — Telephone Encounter (Signed)
I faxed a copy of the patient's CRP and Sed Rate to his PCP, Dr. Okey Regal after speaking with her office assistant, Jess. Fax confirmation of delivery obtained.

## 2018-12-09 ENCOUNTER — Encounter: Payer: Self-pay | Admitting: Urology

## 2018-12-11 ENCOUNTER — Encounter: Payer: Self-pay | Admitting: Urology

## 2018-12-18 ENCOUNTER — Ambulatory Visit: Payer: Self-pay | Admitting: Urology

## 2019-01-20 MED ORDER — GENERIC EXTERNAL MEDICATION
1.00 | Status: DC
Start: 2019-01-21 — End: 2019-01-20

## 2019-01-20 MED ORDER — SODIUM CHLORIDE 0.9 % IV SOLN
200.00 | INTRAVENOUS | Status: DC
Start: ? — End: 2019-01-20

## 2019-01-20 MED ORDER — MAGNESIUM OXIDE 400 MG PO TABS
400.00 | ORAL_TABLET | ORAL | Status: DC
Start: 2019-01-20 — End: 2019-01-20

## 2019-01-20 MED ORDER — FAMOTIDINE 20 MG/2ML IV SOLN
20.00 | INTRAVENOUS | Status: DC
Start: ? — End: 2019-01-20

## 2019-01-20 MED ORDER — DEXTROSE 10 % IV SOLN
25.00 | INTRAVENOUS | Status: DC
Start: ? — End: 2019-01-20

## 2019-01-20 MED ORDER — METHYLPREDNISOLONE SODIUM SUCC 125 MG IJ SOLR
125.00 | INTRAMUSCULAR | Status: DC
Start: ? — End: 2019-01-20

## 2019-01-20 MED ORDER — BISACODYL 10 MG RE SUPP
10.00 | RECTAL | Status: DC
Start: ? — End: 2019-01-20

## 2019-01-20 MED ORDER — ACETAMINOPHEN 325 MG PO TABS
650.00 | ORAL_TABLET | ORAL | Status: DC
Start: ? — End: 2019-01-20

## 2019-01-20 MED ORDER — ALBUTEROL SULFATE HFA 108 (90 BASE) MCG/ACT IN AERS
2.00 | INHALATION_SPRAY | RESPIRATORY_TRACT | Status: DC
Start: ? — End: 2019-01-20

## 2019-01-20 MED ORDER — DIPHENHYDRAMINE HCL 50 MG/ML IJ SOLN
25.00 | INTRAMUSCULAR | Status: DC
Start: ? — End: 2019-01-20

## 2019-01-20 MED ORDER — GENERIC EXTERNAL MEDICATION
2.00 | Status: DC
Start: 2019-01-21 — End: 2019-01-20

## 2019-01-20 MED ORDER — DOCUSATE SODIUM 100 MG PO CAPS
100.00 | ORAL_CAPSULE | ORAL | Status: DC
Start: 2019-01-20 — End: 2019-01-20

## 2019-01-20 MED ORDER — ACD-A NOCLOT-50 0.73-2.45-2.2 GM/100ML VI SOLN
750.00 | Status: DC
Start: ? — End: 2019-01-20

## 2019-01-20 MED ORDER — GENERIC EXTERNAL MEDICATION
Status: DC
Start: 2019-01-21 — End: 2019-01-20

## 2019-01-20 MED ORDER — GENERIC EXTERNAL MEDICATION
Status: DC
Start: ? — End: 2019-01-20

## 2019-01-20 MED ORDER — POLYETHYLENE GLYCOL 3350 17 G PO PACK
17.00 | PACK | ORAL | Status: DC
Start: 2019-01-20 — End: 2019-01-20

## 2019-01-20 MED ORDER — GENERIC EXTERNAL MEDICATION
800.00 | Status: DC
Start: 2019-01-21 — End: 2019-01-20

## 2019-01-20 MED ORDER — MELATONIN 3 MG PO TABS
3.00 | ORAL_TABLET | ORAL | Status: DC
Start: 2019-01-20 — End: 2019-01-20

## 2019-01-20 MED ORDER — FAMOTIDINE 20 MG PO TABS
20.00 | ORAL_TABLET | ORAL | Status: DC
Start: 2019-01-20 — End: 2019-01-20

## 2019-01-20 MED ORDER — DEXAMETHASONE SODIUM PHOSPHATE 4 MG/ML IJ SOLN
20.00 | INTRAMUSCULAR | Status: DC
Start: ? — End: 2019-01-20

## 2019-01-20 MED ORDER — MECLIZINE HCL 12.5 MG PO TABS
12.50 | ORAL_TABLET | ORAL | Status: DC
Start: ? — End: 2019-01-20

## 2019-01-20 MED ORDER — SODIUM CHLORIDE 0.9 % IV SOLN
20.00 | INTRAVENOUS | Status: DC
Start: ? — End: 2019-01-20

## 2019-01-20 MED ORDER — DAPSONE 100 MG PO TABS
100.00 | ORAL_TABLET | ORAL | Status: DC
Start: 2019-01-21 — End: 2019-01-20

## 2019-01-20 MED ORDER — SODIUM CHLORIDE 0.9 % IV SOLN
1000.00 | INTRAVENOUS | Status: DC
Start: ? — End: 2019-01-20

## 2019-01-20 MED ORDER — LORAZEPAM 1 MG PO TABS
1.00 | ORAL_TABLET | ORAL | Status: DC
Start: ? — End: 2019-01-20

## 2019-01-20 MED ORDER — MELATONIN 3 MG PO TABS
3.00 | ORAL_TABLET | ORAL | Status: DC
Start: ? — End: 2019-01-20

## 2019-01-20 MED ORDER — DARBEPOETIN ALFA 100 MCG/ML IJ SOLN
100.00 | INTRAMUSCULAR | Status: DC
Start: 2019-01-22 — End: 2019-01-20

## 2019-01-20 MED ORDER — ONDANSETRON 4 MG PO TBDP
4.00 | ORAL_TABLET | ORAL | Status: DC
Start: ? — End: 2019-01-20

## 2019-01-20 MED ORDER — EPINEPHRINE 0.3 MG/0.3ML IJ SOAJ
0.30 | INTRAMUSCULAR | Status: DC
Start: ? — End: 2019-01-20

## 2020-08-10 IMAGING — DX DG CHEST 2V
3 series · 3 of 3 positions shown · non-contrast
Comparison: None.

CLINICAL DATA: Fever

EXAM:
CHEST - 2 VIEW

[chest lat (1 of 2)]
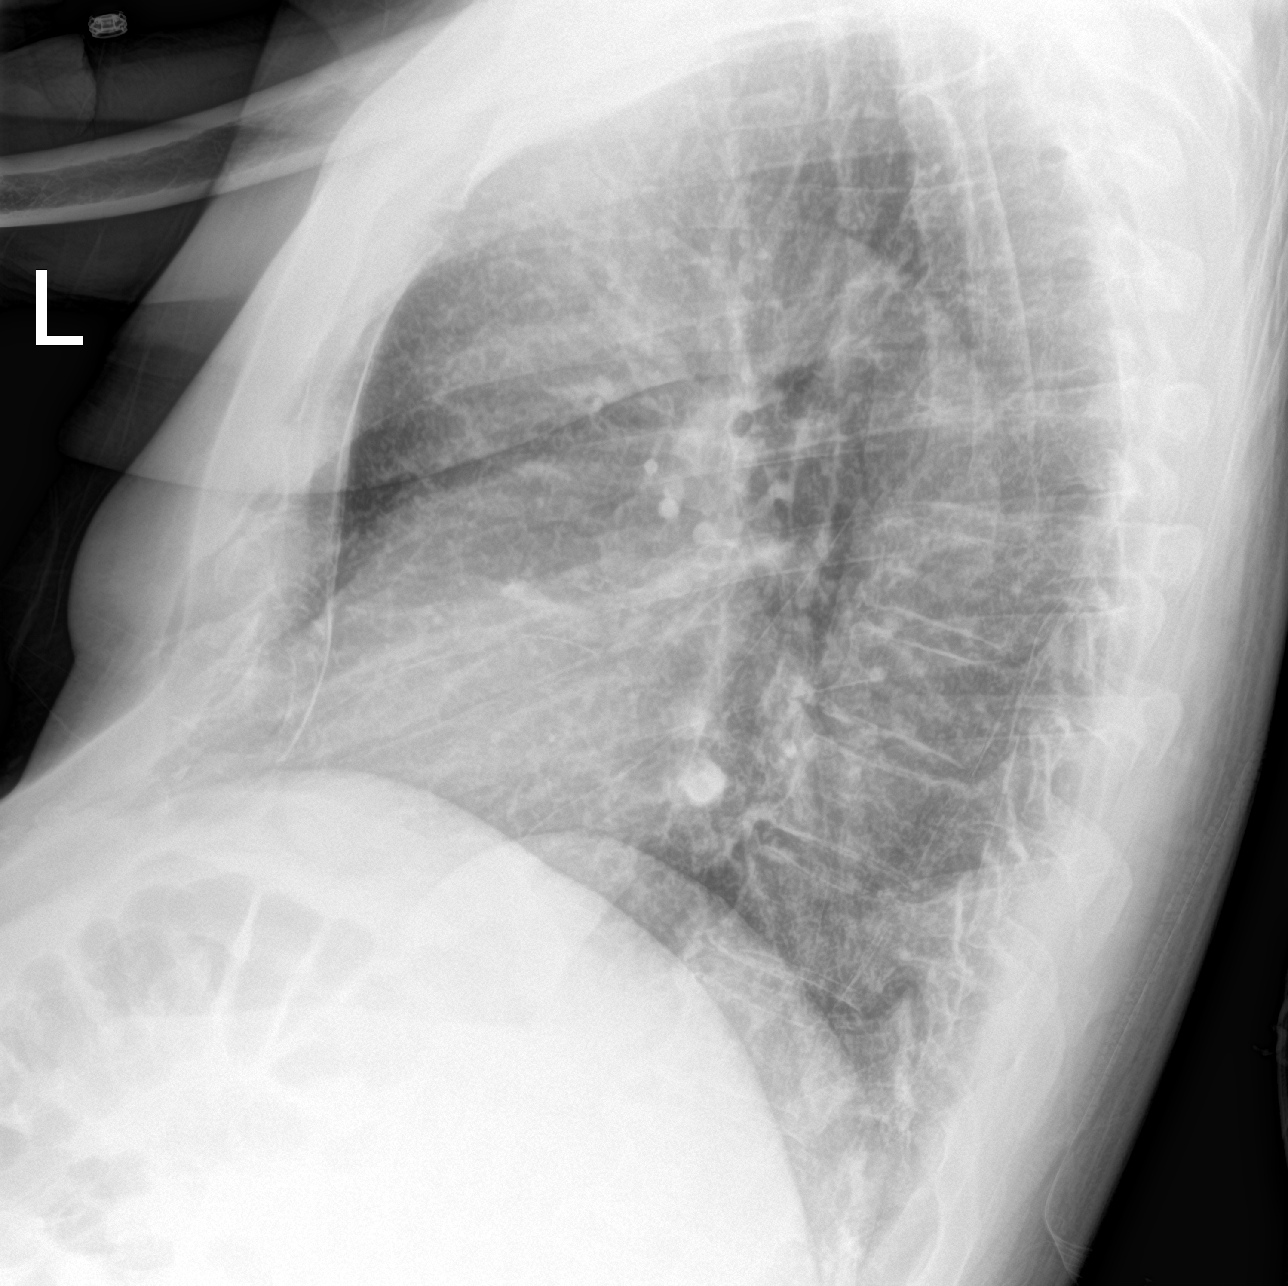

[chest ap]
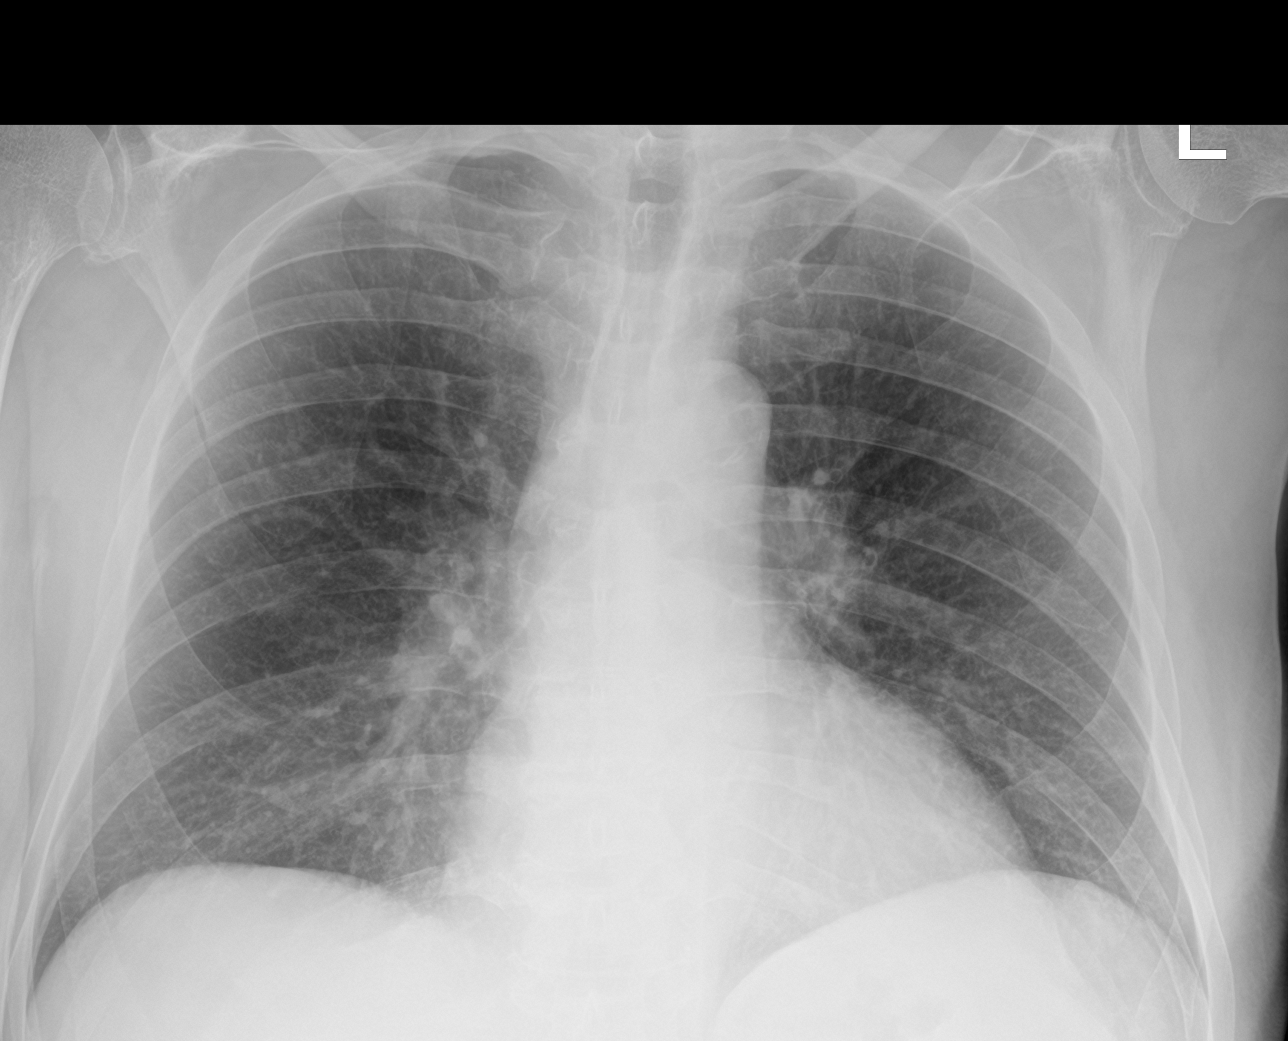

[chest lat (2 of 2)]
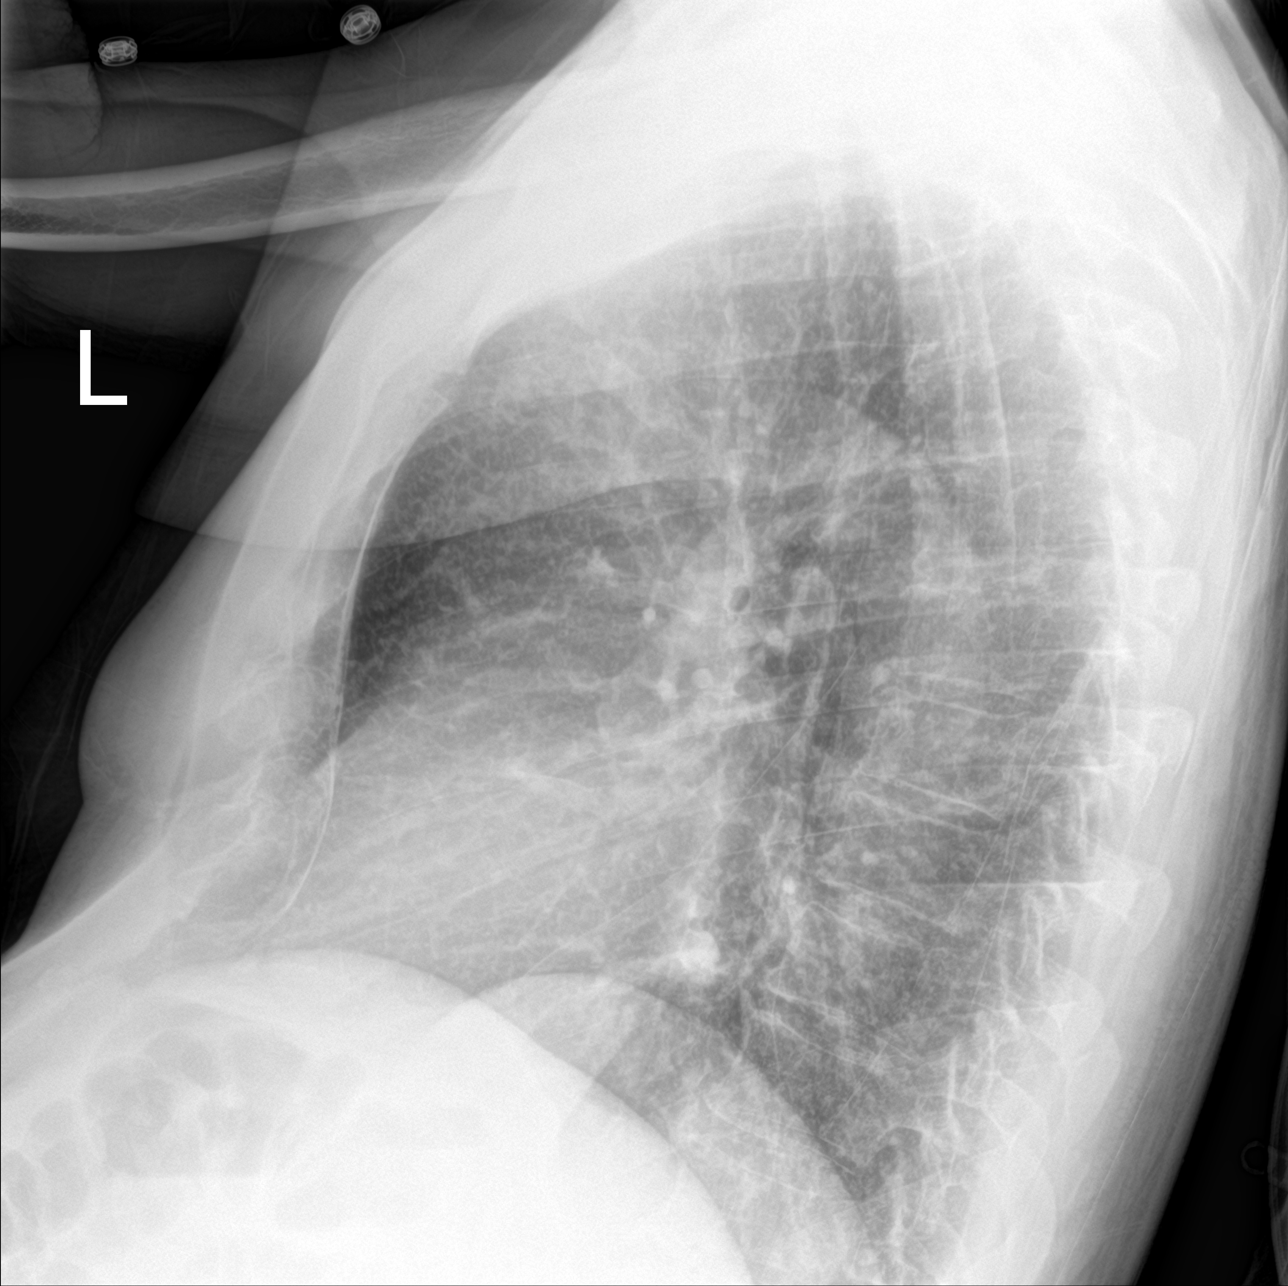

[3 of 3 positions shown; findings below may reference images not displayed]

FINDINGS: No focal airspace disease or effusion. Normal heart size. Aortic
atherosclerosis. No pneumothorax.
IMPRESSION: No active cardiopulmonary disease.

## 2020-08-10 IMAGING — US US RENAL
1 series · 14 of 25 positions shown · non-contrast
Comparison: CT 11/14/2018

CLINICAL DATA: Acute renal failure

EXAM:
RENAL / URINARY TRACT ULTRASOUND COMPLETE

[Series 1: us renal · 14 of 29 slices shown]
[im 1/29]
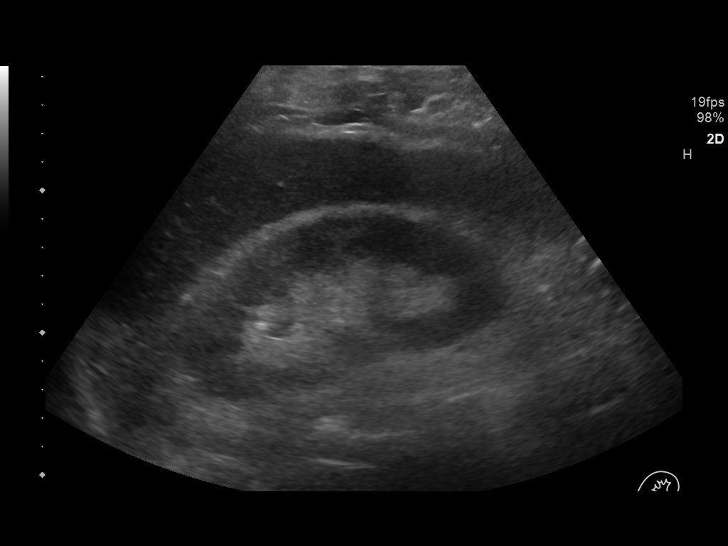
[im 3/29]
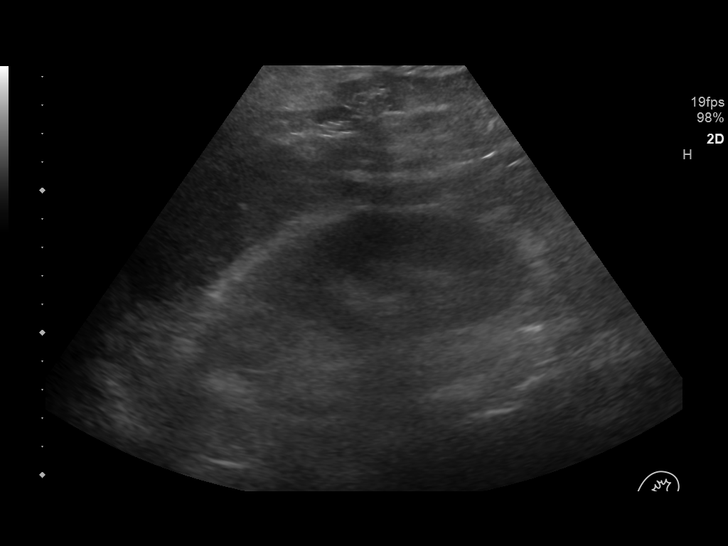
[im 5/29]
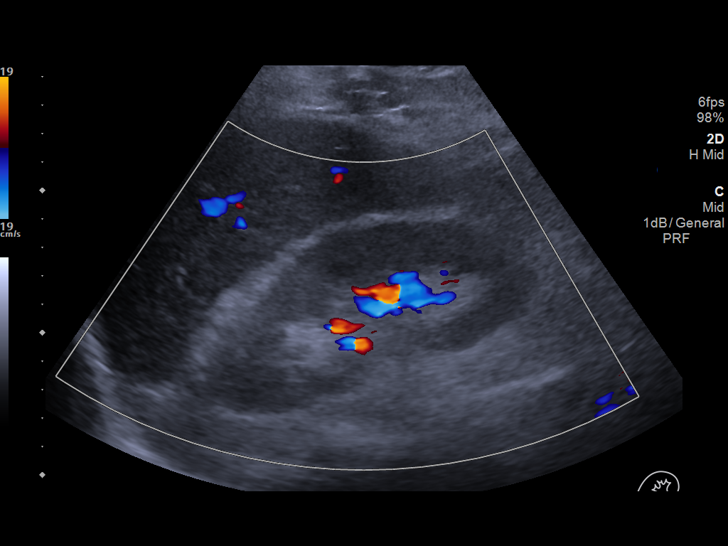
[im 8/29]
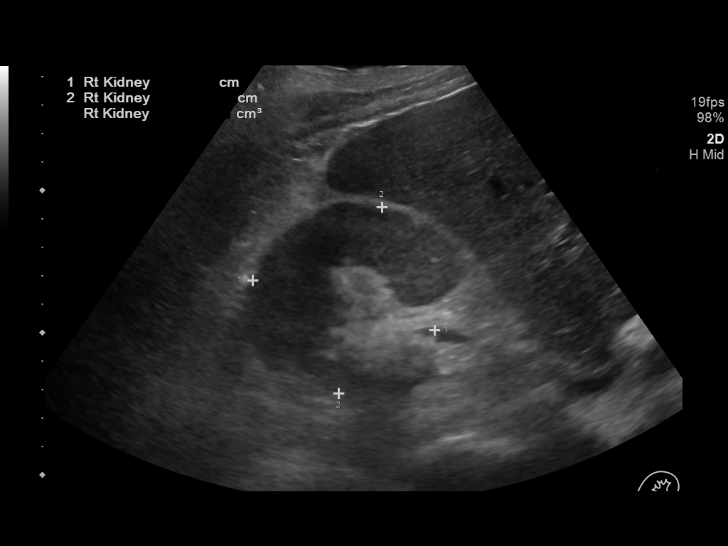
[im 10/29]
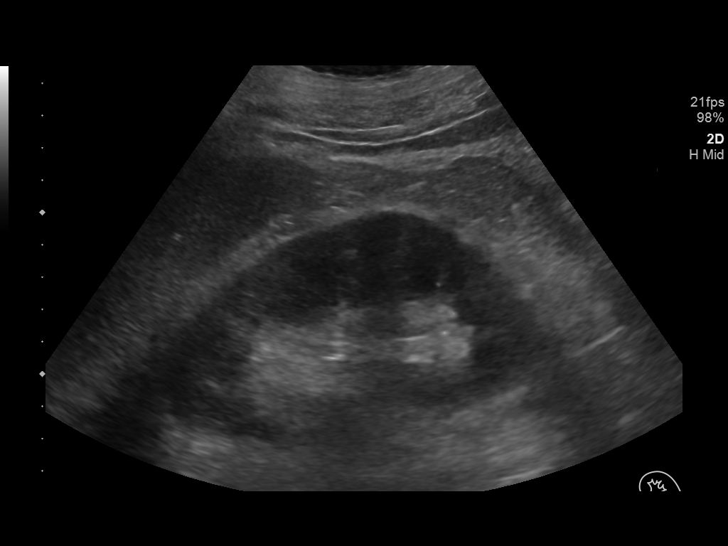
[im 11/29]
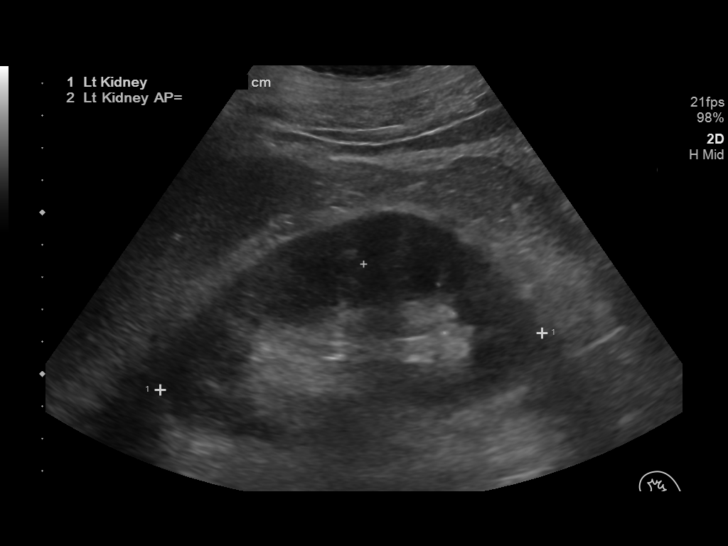
[im 13/29]
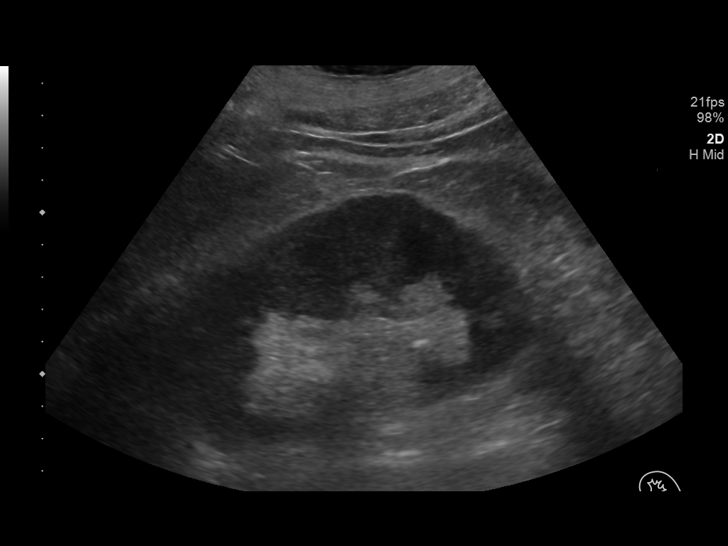
[im 16/29]
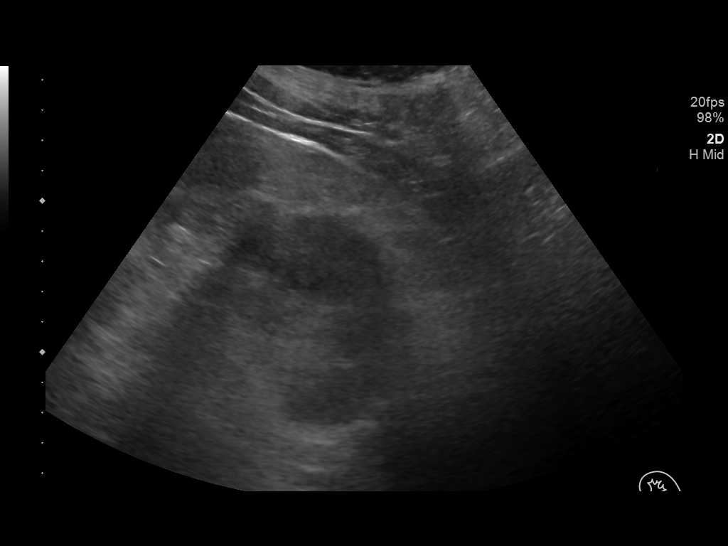
[im 18/29]
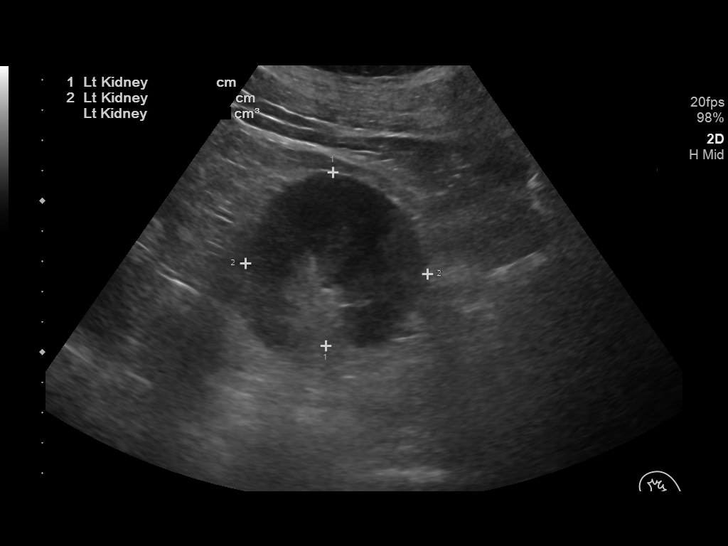
[im 19/29]
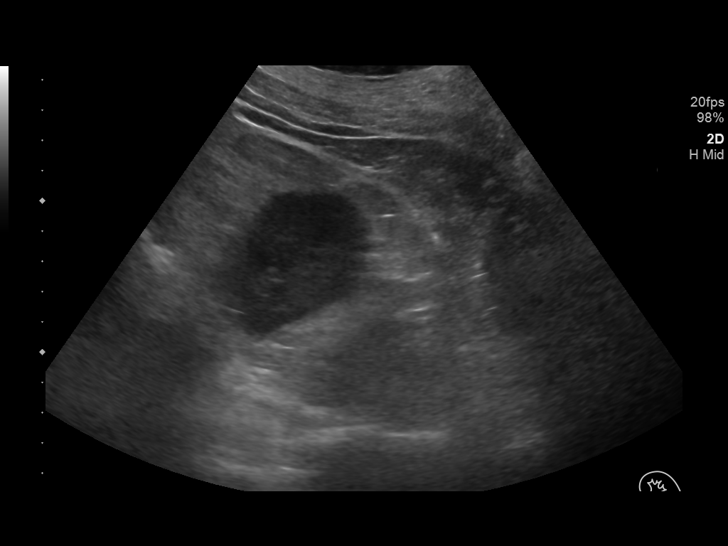
[im 22/29]
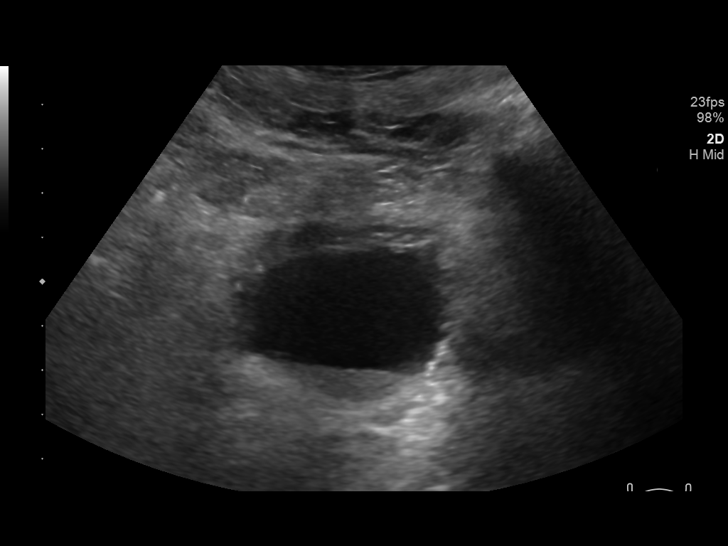
[im 24/29]
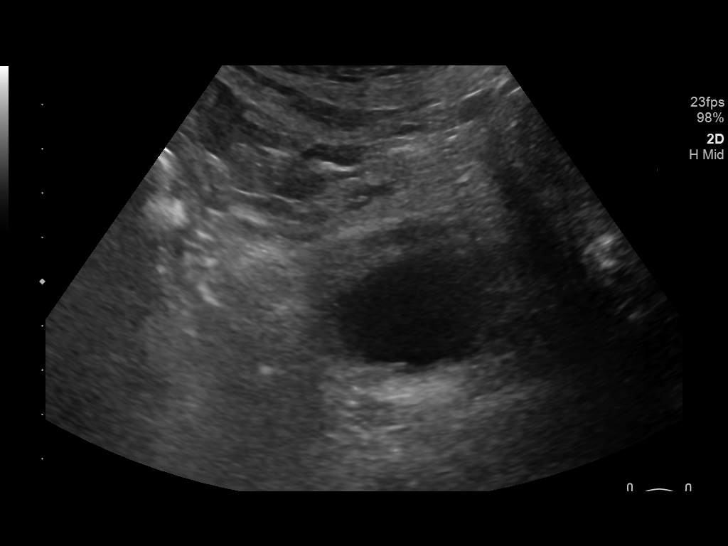
[im 26/29]
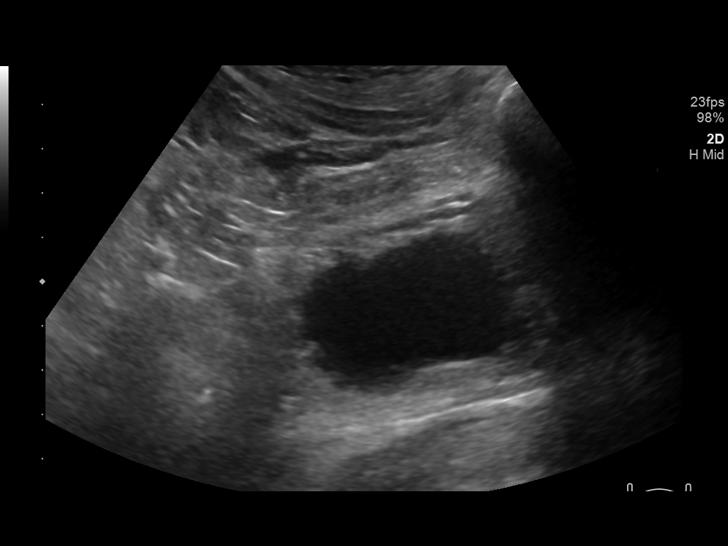
[im 29/29]
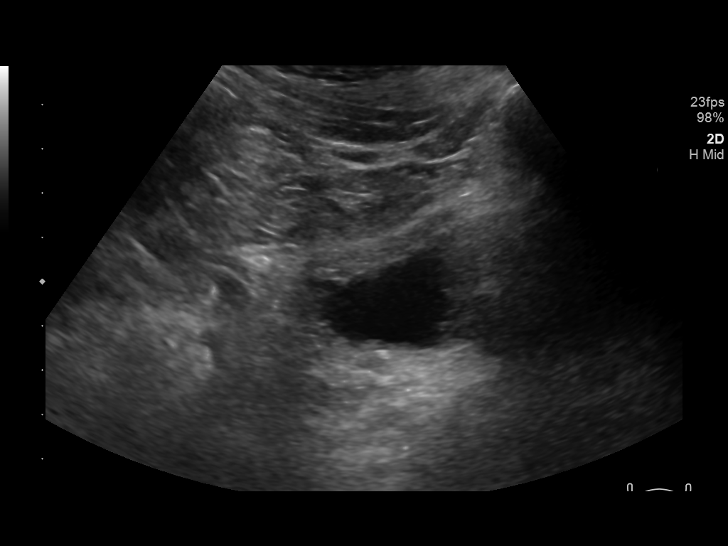

[14 of 25 positions shown; findings below may reference images not displayed]

FINDINGS: Right Kidney:

Renal measurements: 11.4 x 6.6 x 6.7 cm = volume: 265 mL .
Echogenicity within normal limits. No mass or hydronephrosis
visualized.

Left Kidney:

Renal measurements: 12 x 5.7 x 6 cm = volume: 217 mL. Echogenicity
within normal limits. No mass or hydronephrosis visualized.

Bladder:

Appears normal for degree of bladder distention.
IMPRESSION: Negative renal ultrasound
